# Patient Record
Sex: Female | Born: 1960 | Race: White | Hispanic: No | Marital: Married | Smoking: Never smoker
Health system: Southern US, Community
[De-identification: ages and names within clinical notes are randomized; demographics above are authoritative.]

## PROBLEM LIST (undated history)

## (undated) DIAGNOSIS — C801 Malignant (primary) neoplasm, unspecified: Secondary | ICD-10-CM

## (undated) DIAGNOSIS — Z923 Personal history of irradiation: Secondary | ICD-10-CM

## (undated) DIAGNOSIS — Z803 Family history of malignant neoplasm of breast: Secondary | ICD-10-CM

## (undated) HISTORY — DX: Family history of malignant neoplasm of breast: Z80.3

## (undated) HISTORY — PX: BREAST EXCISIONAL BIOPSY: SUR124

## (undated) HISTORY — PX: BREAST CYST ASPIRATION: SHX578

---

## 1998-02-02 ENCOUNTER — Other Ambulatory Visit: Admission: RE | Admit: 1998-02-02 | Discharge: 1998-02-02 | Payer: Self-pay | Admitting: Gynecology

## 1998-05-24 ENCOUNTER — Other Ambulatory Visit: Admission: RE | Admit: 1998-05-24 | Discharge: 1998-05-24 | Payer: Self-pay | Admitting: Gynecology

## 1998-09-21 ENCOUNTER — Other Ambulatory Visit: Admission: RE | Admit: 1998-09-21 | Discharge: 1998-09-21 | Payer: Self-pay | Admitting: Gynecology

## 1999-03-29 ENCOUNTER — Other Ambulatory Visit: Admission: RE | Admit: 1999-03-29 | Discharge: 1999-03-29 | Payer: Self-pay | Admitting: Gynecology

## 1999-03-29 ENCOUNTER — Encounter: Payer: Self-pay | Admitting: Gynecology

## 1999-03-29 ENCOUNTER — Encounter: Admission: RE | Admit: 1999-03-29 | Discharge: 1999-03-29 | Payer: Self-pay | Admitting: Gynecology

## 1999-10-12 ENCOUNTER — Other Ambulatory Visit: Admission: RE | Admit: 1999-10-12 | Discharge: 1999-10-12 | Payer: Self-pay | Admitting: Gynecology

## 2000-03-29 ENCOUNTER — Other Ambulatory Visit: Admission: RE | Admit: 2000-03-29 | Discharge: 2000-03-29 | Payer: Self-pay | Admitting: Gynecology

## 2000-03-29 ENCOUNTER — Encounter: Admission: RE | Admit: 2000-03-29 | Discharge: 2000-03-29 | Payer: Self-pay | Admitting: Gynecology

## 2000-03-29 ENCOUNTER — Encounter: Payer: Self-pay | Admitting: Gynecology

## 2001-04-02 ENCOUNTER — Encounter: Admission: RE | Admit: 2001-04-02 | Discharge: 2001-04-02 | Payer: Self-pay | Admitting: Gynecology

## 2001-04-02 ENCOUNTER — Other Ambulatory Visit: Admission: RE | Admit: 2001-04-02 | Discharge: 2001-04-02 | Payer: Self-pay | Admitting: Gynecology

## 2001-04-02 ENCOUNTER — Encounter: Payer: Self-pay | Admitting: Gynecology

## 2002-05-06 ENCOUNTER — Other Ambulatory Visit: Admission: RE | Admit: 2002-05-06 | Discharge: 2002-05-06 | Payer: Self-pay | Admitting: Gynecology

## 2002-05-06 ENCOUNTER — Encounter: Admission: RE | Admit: 2002-05-06 | Discharge: 2002-05-06 | Payer: Self-pay | Admitting: Gynecology

## 2002-05-06 ENCOUNTER — Encounter: Payer: Self-pay | Admitting: Gynecology

## 2002-09-09 ENCOUNTER — Other Ambulatory Visit: Admission: RE | Admit: 2002-09-09 | Discharge: 2002-09-09 | Payer: Self-pay | Admitting: Dermatology

## 2003-05-26 ENCOUNTER — Other Ambulatory Visit: Admission: RE | Admit: 2003-05-26 | Discharge: 2003-05-26 | Payer: Self-pay | Admitting: Gynecology

## 2003-05-26 ENCOUNTER — Encounter: Admission: RE | Admit: 2003-05-26 | Discharge: 2003-05-26 | Payer: Self-pay | Admitting: Gynecology

## 2004-06-28 ENCOUNTER — Other Ambulatory Visit: Admission: RE | Admit: 2004-06-28 | Discharge: 2004-06-28 | Payer: Self-pay | Admitting: Gynecology

## 2004-06-28 ENCOUNTER — Encounter: Admission: RE | Admit: 2004-06-28 | Discharge: 2004-06-28 | Payer: Self-pay | Admitting: Gynecology

## 2004-07-03 ENCOUNTER — Encounter: Admission: RE | Admit: 2004-07-03 | Discharge: 2004-07-03 | Payer: Self-pay | Admitting: Gynecology

## 2004-09-20 ENCOUNTER — Encounter: Admission: RE | Admit: 2004-09-20 | Discharge: 2004-09-20 | Payer: Self-pay | Admitting: Gynecology

## 2005-01-22 ENCOUNTER — Other Ambulatory Visit: Admission: RE | Admit: 2005-01-22 | Discharge: 2005-01-22 | Payer: Self-pay | Admitting: Dermatology

## 2005-07-04 ENCOUNTER — Encounter: Admission: RE | Admit: 2005-07-04 | Discharge: 2005-07-04 | Payer: Self-pay | Admitting: Gynecology

## 2005-07-04 ENCOUNTER — Other Ambulatory Visit: Admission: RE | Admit: 2005-07-04 | Discharge: 2005-07-04 | Payer: Self-pay | Admitting: Gynecology

## 2005-07-06 ENCOUNTER — Encounter: Admission: RE | Admit: 2005-07-06 | Discharge: 2005-07-06 | Payer: Self-pay | Admitting: Gynecology

## 2006-07-10 ENCOUNTER — Encounter: Admission: RE | Admit: 2006-07-10 | Discharge: 2006-07-10 | Payer: Self-pay | Admitting: Gynecology

## 2006-07-10 ENCOUNTER — Other Ambulatory Visit: Admission: RE | Admit: 2006-07-10 | Discharge: 2006-07-10 | Payer: Self-pay | Admitting: Gynecology

## 2006-07-24 ENCOUNTER — Encounter: Admission: RE | Admit: 2006-07-24 | Discharge: 2006-07-24 | Payer: Self-pay | Admitting: Gynecology

## 2007-08-04 ENCOUNTER — Encounter: Admission: RE | Admit: 2007-08-04 | Discharge: 2007-08-04 | Payer: Self-pay | Admitting: Gynecology

## 2007-08-29 ENCOUNTER — Encounter: Admission: RE | Admit: 2007-08-29 | Discharge: 2007-08-29 | Payer: Self-pay | Admitting: Gynecology

## 2008-03-26 ENCOUNTER — Encounter: Admission: RE | Admit: 2008-03-26 | Discharge: 2008-03-26 | Payer: Self-pay | Admitting: Gynecology

## 2008-08-04 ENCOUNTER — Encounter: Admission: RE | Admit: 2008-08-04 | Discharge: 2008-08-04 | Payer: Self-pay | Admitting: Gynecology

## 2009-08-19 ENCOUNTER — Encounter: Admission: RE | Admit: 2009-08-19 | Discharge: 2009-08-19 | Payer: Self-pay | Admitting: Gynecology

## 2009-10-04 ENCOUNTER — Encounter: Admission: RE | Admit: 2009-10-04 | Discharge: 2009-10-04 | Payer: Self-pay | Admitting: Gynecology

## 2010-05-14 ENCOUNTER — Encounter: Payer: Self-pay | Admitting: Gynecology

## 2010-07-17 ENCOUNTER — Other Ambulatory Visit: Payer: Self-pay | Admitting: Gynecology

## 2010-07-17 DIAGNOSIS — Z1231 Encounter for screening mammogram for malignant neoplasm of breast: Secondary | ICD-10-CM

## 2010-08-23 ENCOUNTER — Ambulatory Visit: Payer: Self-pay

## 2010-09-13 ENCOUNTER — Ambulatory Visit
Admission: RE | Admit: 2010-09-13 | Discharge: 2010-09-13 | Disposition: A | Discharge status: Home / Self Care | Payer: 59 | Source: Ambulatory Visit | Attending: Gynecology | Admitting: Gynecology

## 2010-09-13 DIAGNOSIS — Z1231 Encounter for screening mammogram for malignant neoplasm of breast: Secondary | ICD-10-CM

## 2011-03-22 ENCOUNTER — Encounter (HOSPITAL_COMMUNITY): Payer: Self-pay | Admitting: Pharmacy Technician

## 2011-03-23 ENCOUNTER — Encounter (HOSPITAL_COMMUNITY): Payer: Self-pay

## 2011-03-23 ENCOUNTER — Encounter (HOSPITAL_COMMUNITY)
Admission: RE | Admit: 2011-03-23 | Discharge: 2011-03-23 | Disposition: A | Discharge status: Home / Self Care | Payer: 59 | Source: Ambulatory Visit | Attending: General Surgery | Admitting: General Surgery

## 2011-03-23 LAB — CBC
HCT: 40.8 % (ref 36.0–46.0)
MCV: 91.7 fL (ref 78.0–100.0)
RBC: 4.45 MIL/uL (ref 3.87–5.11)
WBC: 9.2 10*3/uL (ref 4.0–10.5)

## 2011-03-23 LAB — DIFFERENTIAL
Eosinophils Relative: 0 % (ref 0–5)
Lymphocytes Relative: 22 % (ref 12–46)
Lymphs Abs: 2 10*3/uL (ref 0.7–4.0)
Monocytes Absolute: 0.6 10*3/uL (ref 0.1–1.0)

## 2011-03-23 LAB — SURGICAL PCR SCREEN: Staphylococcus aureus: POSITIVE — AB

## 2011-03-23 LAB — BASIC METABOLIC PANEL
CO2: 28 mEq/L (ref 19–32)
Chloride: 102 mEq/L (ref 96–112)
Potassium: 4.5 mEq/L (ref 3.5–5.1)
Sodium: 136 mEq/L (ref 135–145)

## 2011-03-23 LAB — HCG, SERUM, QUALITATIVE: Preg, Serum: NEGATIVE

## 2011-03-23 NOTE — H&P (Signed)
  NTS SOAP Note  Vital Signs:  Vitals as of: 02/22/2011: Systolic 119: Diastolic 71: Heart Rate 68: Temp 97.59F: Height 45ft 5.5in: Weight 139Lbs 0 Ounces: Pain Level 0: BMI 23  BMI : 22.78 kg/m2  Subjective: This 50 Years 1 Months old Female presents for of knot on abdominal wall.  Patient has noted a nodule on her left lower abdominal wall for several months.  Had a similar nodule/cyst in the past but is was excised.  No discharge.  No fevers or chills.  No pain.  Review of Symptoms:  Constitutional:unremarkable Head:unremarkable Eyes:unremarkable Nose/Mouth/Throat:unremarkable Cardiovascular:unremarkable Respiratory:unremarkable Gastrointestinal:unremarkable Genitourinary:unremarkable Musculoskeletal:unremarkable Skin:unremarkable Breast:unremarkable Hematolgic/Lymphatic:unremarkable Allergic/Immunologic:unremarkable   Past Medical History:Obtained   Past Medical History  Pregnancy Gravida: 1 Pregnancy Para: 1 Surgical History: none Medical Problems: none Psychiatric History: none Allergies: NKDA Medications: none   Social History:Obtained   Social History  Preferred Language: English ([place]) Race:  White Ethnicity: Not Hispanic / Latino Age: 50 Years 1 Months Marital Status:  M Alcohol: no Recreational drug(s): no   Smoking Status: Never smoker reviewed on 02/22/2011  Family History:Obtained   Family History  mother with breast ca    Objective Information: General:Well appearing, well nourished in no distress. Skin:no rash or prominent lesions Head:Atraumatic; no masses; no abnormalities Eyes:conjunctiva clear, EOM intact, PERRL Mouth:Mucous membranes moist, no mucosal lesions. Neck:Supple without lymphadenopathy.  Heart:RRR, no murmur Lungs:CTA bilaterally, no wheezes, rhonchi, rales.  Breathing unlabored. Abdomen:Soft, NT/ND, no HSM, no masses.Small mobile  non-tender nodule on the L lower abdominal wall.  Assessment:  Diagnosis &amp; Procedure: DiagnosisCode: 782.2, ProcedureCode: 14782,    Plan: Options discussed with the patient.  Will schedule at the patient's convenience.  Patient Education:Alternative treatments to surgery were discussed with patient (and family).Risks and benefits  of procedure were fully explained to the patient (and family) who gave informed consent. Patient/family questions were addressed.  Follow-up:Pending Surgery

## 2011-03-23 NOTE — Patient Instructions (Addendum)
20 Rebekah Green  03/23/2011   Your procedure is scheduled on:  03/30/2011  Report to [facility] at [place] AM.  Call this number if you have problems the morning of surgery: [phone]   Remember:   Do not eat food:After Midnight.  May have clear liquids:until Midnight .  Clear liquids include soda, tea, black coffee, apple or grape juice, broth.  Take these medicines the morning of surgery with A SIP OF WATER:    Do not wear jewelry, make-up or nail polish.  Do not wear lotions, powders, or perfumes. You may wear deodorant.  Do not shave 48 hours prior to surgery.  Do not bring valuables to the hospital.  Contacts, dentures or bridgework may not be worn into surgery.  Leave suitcase in the car. After surgery it may be brought to your room.  For patients admitted to the hospital, checkout time is 11:00 AM the day of discharge.   Patients discharged the day of surgery will not be allowed to drive home.  Name and phone number of your driver:   Special Instructions: CHG Shower Shower 2 days before surgery and 1 day before surgery with Hibiclens.   Please read over the following fact sheets that you were given: Pain Booklet, MRSA Information, Surgical Site Infection Prevention, Anesthesia Post-op Instructions and Care and Recovery After Surgery

## 2011-03-30 ENCOUNTER — Encounter (HOSPITAL_COMMUNITY): Payer: Self-pay | Admitting: Anesthesiology

## 2011-03-30 ENCOUNTER — Ambulatory Visit (HOSPITAL_COMMUNITY): Payer: 59 | Admitting: Anesthesiology

## 2011-03-30 ENCOUNTER — Encounter (HOSPITAL_COMMUNITY)
Admission: RE | Disposition: A | Discharge status: Home / Self Care | Payer: Self-pay | Source: Ambulatory Visit | Attending: General Surgery

## 2011-03-30 ENCOUNTER — Other Ambulatory Visit: Payer: Self-pay | Admitting: General Surgery

## 2011-03-30 ENCOUNTER — Encounter (HOSPITAL_COMMUNITY): Payer: Self-pay | Admitting: *Deleted

## 2011-03-30 ENCOUNTER — Ambulatory Visit (HOSPITAL_COMMUNITY)
Admission: RE | Admit: 2011-03-30 | Discharge: 2011-03-30 | Disposition: A | Discharge status: Home / Self Care | Payer: 59 | Source: Ambulatory Visit | Attending: General Surgery | Admitting: General Surgery

## 2011-03-30 DIAGNOSIS — Z01812 Encounter for preprocedural laboratory examination: Secondary | ICD-10-CM | POA: Insufficient documentation

## 2011-03-30 DIAGNOSIS — L723 Sebaceous cyst: Secondary | ICD-10-CM | POA: Insufficient documentation

## 2011-03-30 DIAGNOSIS — M7989 Other specified soft tissue disorders: Secondary | ICD-10-CM

## 2011-03-30 HISTORY — PX: MASS EXCISION: SHX2000

## 2011-03-30 SURGERY — EXCISION MASS
Anesthesia: General | Site: Abdomen | Wound class: Clean

## 2011-03-30 MED ORDER — LIDOCAINE HCL (PF) 1 % IJ SOLN
INTRAMUSCULAR | Status: AC
  Filled 2011-03-30: qty 30

## 2011-03-30 MED ORDER — LACTATED RINGERS IV SOLN
INTRAVENOUS | Status: DC | PRN
  Administered 2011-03-30: 07:00:00 via INTRAVENOUS

## 2011-03-30 MED ORDER — CEFAZOLIN SODIUM 1-5 GM-% IV SOLN: 1.0000 g | INTRAVENOUS | Status: DC

## 2011-03-30 MED ORDER — SODIUM CHLORIDE 0.9 % IR SOLN
Status: DC | PRN
  Administered 2011-03-30: 1000 mL

## 2011-03-30 MED ORDER — MIDAZOLAM HCL 2 MG/2ML IJ SOLN
1.0000 mg | INTRAMUSCULAR | Status: DC | PRN
Start: 2011-03-30 — End: 2011-03-30
  Administered 2011-03-30: 2 mg via INTRAVENOUS

## 2011-03-30 MED ORDER — CELECOXIB 100 MG PO CAPS
400.0000 mg | ORAL_CAPSULE | Freq: Every day | ORAL | Status: AC
  Administered 2011-03-30: 400 mg via ORAL

## 2011-03-30 MED ORDER — CEFAZOLIN SODIUM 1-5 GM-% IV SOLN
INTRAVENOUS | Status: AC
  Filled 2011-03-30: qty 50

## 2011-03-30 MED ORDER — ONDANSETRON HCL 4 MG/2ML IJ SOLN: 4.0000 mg | Freq: Once | INTRAMUSCULAR | Status: DC | PRN

## 2011-03-30 MED ORDER — ONDANSETRON HCL 4 MG/2ML IJ SOLN
INTRAMUSCULAR | Status: AC
  Administered 2011-03-30: 4 mg via INTRAVENOUS
  Filled 2011-03-30: qty 2

## 2011-03-30 MED ORDER — CELECOXIB 100 MG PO CAPS
ORAL_CAPSULE | ORAL | Status: AC
  Administered 2011-03-30: 400 mg via ORAL
  Filled 2011-03-30: qty 4

## 2011-03-30 MED ORDER — LIDOCAINE HCL (PF) 1 % IJ SOLN
INTRAMUSCULAR | Status: AC
  Filled 2011-03-30: qty 5

## 2011-03-30 MED ORDER — FENTANYL CITRATE 0.05 MG/ML IJ SOLN
INTRAMUSCULAR | Status: DC | PRN
  Administered 2011-03-30 (×2): 50 ug via INTRAVENOUS

## 2011-03-30 MED ORDER — FENTANYL CITRATE 0.05 MG/ML IJ SOLN
INTRAMUSCULAR | Status: AC
  Filled 2011-03-30: qty 5

## 2011-03-30 MED ORDER — ONDANSETRON HCL 4 MG/2ML IJ SOLN
4.0000 mg | Freq: Once | INTRAMUSCULAR | Status: AC
  Administered 2011-03-30: 4 mg via INTRAVENOUS

## 2011-03-30 MED ORDER — FENTANYL CITRATE 0.05 MG/ML IJ SOLN: 25.0000 ug | INTRAMUSCULAR | Status: DC | PRN

## 2011-03-30 MED ORDER — LACTATED RINGERS IV SOLN: INTRAVENOUS | Status: DC

## 2011-03-30 MED ORDER — PROPOFOL 10 MG/ML IV EMUL
INTRAVENOUS | Status: AC
  Filled 2011-03-30: qty 20

## 2011-03-30 MED ORDER — PROPOFOL 10 MG/ML IV EMUL
INTRAVENOUS | Status: DC | PRN
  Administered 2011-03-30: 150 mg via INTRAVENOUS

## 2011-03-30 MED ORDER — GLYCOPYRROLATE 0.2 MG/ML IJ SOLN
0.2000 mg | Freq: Once | INTRAMUSCULAR | Status: AC | PRN
  Administered 2011-03-30: 0.2 mg via INTRAVENOUS

## 2011-03-30 MED ORDER — GLYCOPYRROLATE 0.2 MG/ML IJ SOLN
INTRAMUSCULAR | Status: AC
  Administered 2011-03-30: 0.2 mg via INTRAVENOUS
  Filled 2011-03-30: qty 1

## 2011-03-30 MED ORDER — HYDROCODONE-ACETAMINOPHEN 5-325 MG PO TABS: 1.0000 | ORAL_TABLET | ORAL | Status: AC | PRN

## 2011-03-30 MED ORDER — BUPIVACAINE HCL (PF) 0.5 % IJ SOLN
INTRAMUSCULAR | Status: DC | PRN
  Administered 2011-03-30: 10 mL

## 2011-03-30 MED ORDER — ACETAMINOPHEN 325 MG PO TABS: 325.0000 mg | ORAL_TABLET | ORAL | Status: DC | PRN

## 2011-03-30 MED ORDER — LACTATED RINGERS IV SOLN
INTRAVENOUS | Status: DC
  Administered 2011-03-30: 1000 mL via INTRAVENOUS

## 2011-03-30 MED ORDER — MIDAZOLAM HCL 2 MG/2ML IJ SOLN
INTRAMUSCULAR | Status: AC
  Filled 2011-03-30: qty 2

## 2011-03-30 MED ORDER — ENOXAPARIN SODIUM 40 MG/0.4ML ~~LOC~~ SOLN
40.0000 mg | Freq: Once | SUBCUTANEOUS | Status: AC
  Administered 2011-03-30: 40 mg via SUBCUTANEOUS

## 2011-03-30 MED ORDER — CEFAZOLIN SODIUM 1-5 GM-% IV SOLN
INTRAVENOUS | Status: DC | PRN
  Administered 2011-03-30: 1 g via INTRAVENOUS

## 2011-03-30 MED ORDER — LIDOCAINE HCL (CARDIAC) 10 MG/ML IV SOLN
INTRAVENOUS | Status: DC | PRN
  Administered 2011-03-30: 50 mg via INTRAVENOUS

## 2011-03-30 MED ORDER — BUPIVACAINE HCL (PF) 0.5 % IJ SOLN
INTRAMUSCULAR | Status: AC
  Filled 2011-03-30: qty 30

## 2011-03-30 MED ORDER — ENOXAPARIN SODIUM 40 MG/0.4ML ~~LOC~~ SOLN
SUBCUTANEOUS | Status: AC
  Filled 2011-03-30: qty 0.4

## 2011-03-30 SURGICAL SUPPLY — 32 items
BAG HAMPER (MISCELLANEOUS) ×2 IMPLANT
BENZOIN TINCTURE PRP APPL 2/3 (GAUZE/BANDAGES/DRESSINGS) ×2 IMPLANT
CLOTH BEACON ORANGE TIMEOUT ST (SAFETY) ×2 IMPLANT
COVER LIGHT HANDLE STERIS (MISCELLANEOUS) ×4 IMPLANT
DURAPREP 26ML APPLICATOR (WOUND CARE) ×2 IMPLANT
ELECT NEEDLE TIP 2.8 STRL (NEEDLE) IMPLANT
ELECT REM PT RETURN 9FT ADLT (ELECTROSURGICAL) ×2
ELECTRODE REM PT RTRN 9FT ADLT (ELECTROSURGICAL) ×1 IMPLANT
FORMALIN 10 PREFIL 120ML (MISCELLANEOUS) ×2 IMPLANT
GLOVE BIOGEL PI IND STRL 7.0 (GLOVE) ×2 IMPLANT
GLOVE BIOGEL PI IND STRL 7.5 (GLOVE) ×1 IMPLANT
GLOVE BIOGEL PI INDICATOR 7.0 (GLOVE) ×2
GLOVE BIOGEL PI INDICATOR 7.5 (GLOVE) ×1
GLOVE ECLIPSE 6.5 STRL STRAW (GLOVE) ×2 IMPLANT
GLOVE ECLIPSE 7.0 STRL STRAW (GLOVE) ×2 IMPLANT
GOWN STRL REIN XL XLG (GOWN DISPOSABLE) ×4 IMPLANT
KIT ROOM TURNOVER APOR (KITS) ×2 IMPLANT
MANIFOLD NEPTUNE II (INSTRUMENTS) ×2 IMPLANT
NEEDLE HYPO 18GX1.5 BLUNT FILL (NEEDLE) IMPLANT
NEEDLE HYPO 25X1 1.5 SAFETY (NEEDLE) ×2 IMPLANT
NS IRRIG 1000ML POUR BTL (IV SOLUTION) ×2 IMPLANT
PACK MINOR (CUSTOM PROCEDURE TRAY) ×2 IMPLANT
PAD ARMBOARD 7.5X6 YLW CONV (MISCELLANEOUS) ×2 IMPLANT
SET BASIN LINEN APH (SET/KITS/TRAYS/PACK) ×2 IMPLANT
SOL PREP PROV IODINE SCRUB 4OZ (MISCELLANEOUS) IMPLANT
STRIP CLOSURE SKIN 1/2X4 (GAUZE/BANDAGES/DRESSINGS) ×2 IMPLANT
SUT MNCRL AB 4-0 PS2 18 (SUTURE) ×2 IMPLANT
SUT PROLENE 3 0 PS 1 (SUTURE) IMPLANT
SUT VIC AB 3-0 SH 27 (SUTURE) ×1
SUT VIC AB 3-0 SH 27X BRD (SUTURE) ×1 IMPLANT
SYR BULB IRRIGATION 50ML (SYRINGE) ×2 IMPLANT
SYR CONTROL 10ML LL (SYRINGE) ×2 IMPLANT

## 2011-03-30 NOTE — Transfer of Care (Signed)
Immediate Anesthesia Transfer of Care Note  Patient: Rebekah Green  Procedure(s) Performed:  EXCISION MASS - Excision Soft Tissue Mass Abdominal Wall  Patient Location: PACU  Anesthesia Type: General  Level of Consciousness: sedated and patient cooperative  Airway & Oxygen Therapy: Patient Spontanous Breathing and Patient connected to face mask oxygen  Post-op Assessment: Report given to PACU RN and Post -op Vital signs reviewed and stable  Post vital signs: Reviewed and stable  Complications: No apparent anesthesia complications

## 2011-03-30 NOTE — Anesthesia Preprocedure Evaluation (Signed)
Anesthesia Evaluation  Patient identified by MRN, date of birth, ID band Patient awake    Reviewed: Allergy & Precautions, H&P , NPO status , Patient's Chart, lab work & pertinent test results  Airway Mallampati: I TM Distance: >3 FB Neck ROM: Full    Dental No notable dental hx.    Pulmonary neg pulmonary ROS,    Pulmonary exam normal       Cardiovascular neg cardio ROS Regular Normal    Neuro/Psych Negative Neurological ROS  Negative Psych ROS   GI/Hepatic negative GI ROS, Neg liver ROS,   Endo/Other  Negative Endocrine ROS  Renal/GU negative Renal ROS  Genitourinary negative   Musculoskeletal negative musculoskeletal ROS (+)   Abdominal Normal abdominal exam  (+)   Peds  Hematology negative hematology ROS (+)   Anesthesia Other Findings   Reproductive/Obstetrics negative OB ROS                           Anesthesia Physical Anesthesia Plan  ASA: I  Anesthesia Plan: General   Post-op Pain Management:    Induction: Intravenous  Airway Management Planned: LMA  Additional Equipment:   Intra-op Plan:   Post-operative Plan: Extubation in OR  Informed Consent: I have reviewed the patients History and Physical, chart, labs and discussed the procedure including the risks, benefits and alternatives for the proposed anesthesia with the patient or authorized representative who has indicated his/her understanding and acceptance.     Plan Discussed with: CRNA  Anesthesia Plan Comments:         Anesthesia Quick Evaluation

## 2011-03-30 NOTE — Interval H&P Note (Signed)
History and Physical Interval Note:  03/30/2011 7:48 AM  Rebekah Green  has presented today for surgery, with the diagnosis of Soft tissue mass [729.90]  The various methods of treatment have been discussed with the patient and family. After consideration of risks, benefits and other options for treatment, the patient has consented to  Procedure(s): EXCISION MASS as a surgical intervention .  The patients' history has been reviewed, patient examined, no change in status, stable for surgery.  I have reviewed the patients' chart and labs.  Questions were answered to the patient's satisfaction.     [name]

## 2011-03-30 NOTE — Anesthesia Procedure Notes (Signed)
Procedure Name: LMA Insertion Date/Time: 03/30/2011 8:11 AM Performed by: Carolyne Littles, Marielouise Amey Pre-anesthesia Checklist: Patient identified, Patient being monitored, Emergency Drugs available, Timeout performed and Suction available Patient Re-evaluated:Patient Re-evaluated prior to inductionOxygen Delivery Method: Circle System Utilized Preoxygenation: Pre-oxygenation with 100% oxygen Intubation Type: IV induction Ventilation: Mask ventilation without difficulty LMA: LMA inserted LMA Size: 4.0 Placement Confirmation: positive ETCO2 and breath sounds checked- equal and bilateral Dental Injury: Teeth and Oropharynx as per pre-operative assessment

## 2011-03-30 NOTE — Preoperative (Signed)
Beta Blockers   Reason not to administer Beta Blockers:Not Applicable 

## 2011-03-30 NOTE — Anesthesia Postprocedure Evaluation (Signed)
  Anesthesia Post-op Note  Patient: Rebekah Green  Procedure(s) Performed:  EXCISION MASS - Excision Soft Tissue Mass Abdominal Wall  Patient Location: PACU  Anesthesia Type: General  Level of Consciousness: sedated and patient cooperative  Airway and Oxygen Therapy: Patient Spontanous Breathing and Patient connected to face mask oxygen  Post-op Pain: none  Post-op Assessment: Post-op Vital signs reviewed, Patient's Cardiovascular Status Stable, Respiratory Function Stable and Patent Airway  Post-op Vital Signs: Reviewed and stable  Complications: No apparent anesthesia complications

## 2011-03-30 NOTE — Op Note (Signed)
Patient:  Rebekah Green  DOB:  [birth year]  MRN:  [id number]   Preop Diagnosis:  Soft tissue mass of the left abdominal wall  Postop Diagnosis:  The same  Procedure:  Excision of soft tissue mass via 2.5 cm incision  Surgeon:  Dr. Tilford Pillar  Anes:  General via laryngeal mask airway  Indications:  Patient is a 50 year old female presented to my office with a history of a nodule on her left anterior abdominal wall. This is slowly increased in size and patient wished for excision due to her concerns of the size change. Risks benefits alternatives were discussed at length the patient including but not limited to risk of bleeding, infection, recurrence, intraoperative cardiac and pulmonary events. Her questions and concerns were addressed the patient was consented for the planned procedure.  Procedure note:  Patient was taken to the or was placed in supine position the operator table time the general anesthetic is administered. At this point a larger mask airway was placed by the nurse anesthetist. Her abdomen is prepped with DuraPrep solution and draped in standard fashion. A 2.5 cm elliptical incision created with a 15 blade scalpel. Additional dissection down to subcuticular tissues carried out using electrocautery including circumferential dissection around the soft tissue nodule. Once free is placed in the back table and sent as a permanent specimen to pathology. Hemostasis is excellent having been attainable electrocautery. The wound is irrigated. Local anesthetic is instilled. A 3-0 Vicryl was utilized reapproximate the deep subcuticular tissue. A 4-0 Monocryl utilized reapproximate the skin edges in a running subcuticular suture. The skin was washed dried moist dry towel. Benzoin is applied around incision. Half-inch Steri-Strips are placed. The drapes removed. The patient was allowed to come out of general anesthetic and stretcher to the postanesthetic care unit in stable condition. At the  conclusion of the procedure all instrument, sponge, needle counts are correct. Patient tolerated procedure extremely well.  Complications:  None  EBL:  Scant  Specimen:  Soft tissue mass

## 2011-04-09 ENCOUNTER — Encounter (HOSPITAL_COMMUNITY): Payer: Self-pay | Admitting: General Surgery

## 2011-09-10 ENCOUNTER — Other Ambulatory Visit: Payer: Self-pay | Admitting: Gynecology

## 2011-09-10 DIAGNOSIS — N6009 Solitary cyst of unspecified breast: Secondary | ICD-10-CM

## 2011-09-10 DIAGNOSIS — Z1231 Encounter for screening mammogram for malignant neoplasm of breast: Secondary | ICD-10-CM

## 2011-09-13 ENCOUNTER — Other Ambulatory Visit: Payer: Self-pay | Admitting: Gynecology

## 2011-09-13 DIAGNOSIS — N6009 Solitary cyst of unspecified breast: Secondary | ICD-10-CM

## 2011-09-14 ENCOUNTER — Ambulatory Visit
Admission: RE | Admit: 2011-09-14 | Discharge: 2011-09-14 | Disposition: A | Discharge status: Home / Self Care | Payer: 59 | Source: Ambulatory Visit | Attending: Gynecology | Admitting: Gynecology

## 2011-09-14 ENCOUNTER — Ambulatory Visit: Payer: 59

## 2011-09-14 DIAGNOSIS — N6009 Solitary cyst of unspecified breast: Secondary | ICD-10-CM

## 2011-09-21 ENCOUNTER — Other Ambulatory Visit: Payer: Self-pay | Admitting: Gynecology

## 2011-09-21 DIAGNOSIS — N6001 Solitary cyst of right breast: Secondary | ICD-10-CM

## 2011-09-26 ENCOUNTER — Other Ambulatory Visit: Payer: 59

## 2012-08-06 ENCOUNTER — Other Ambulatory Visit: Payer: Self-pay

## 2012-08-06 DIAGNOSIS — Z1231 Encounter for screening mammogram for malignant neoplasm of breast: Secondary | ICD-10-CM

## 2012-10-01 ENCOUNTER — Ambulatory Visit
Admission: RE | Admit: 2012-10-01 | Discharge: 2012-10-01 | Disposition: A | Discharge status: Home / Self Care | Payer: 59 | Source: Ambulatory Visit

## 2012-10-01 DIAGNOSIS — Z1231 Encounter for screening mammogram for malignant neoplasm of breast: Secondary | ICD-10-CM

## 2013-09-25 ENCOUNTER — Other Ambulatory Visit: Payer: Self-pay

## 2013-09-25 DIAGNOSIS — Z1231 Encounter for screening mammogram for malignant neoplasm of breast: Secondary | ICD-10-CM

## 2013-10-02 ENCOUNTER — Ambulatory Visit
Admission: RE | Admit: 2013-10-02 | Discharge: 2013-10-02 | Disposition: A | Discharge status: Home / Self Care | Payer: 59 | Source: Ambulatory Visit

## 2013-10-02 DIAGNOSIS — Z1231 Encounter for screening mammogram for malignant neoplasm of breast: Secondary | ICD-10-CM

## 2014-11-17 ENCOUNTER — Other Ambulatory Visit: Payer: Self-pay

## 2014-11-17 DIAGNOSIS — Z1231 Encounter for screening mammogram for malignant neoplasm of breast: Secondary | ICD-10-CM

## 2014-11-17 DIAGNOSIS — Z803 Family history of malignant neoplasm of breast: Secondary | ICD-10-CM

## 2014-12-31 ENCOUNTER — Ambulatory Visit: Payer: Self-pay

## 2015-02-04 ENCOUNTER — Ambulatory Visit: Payer: Self-pay

## 2015-02-24 ENCOUNTER — Ambulatory Visit
Admission: RE | Admit: 2015-02-24 | Discharge: 2015-02-24 | Disposition: A | Discharge status: Home / Self Care | Payer: 59 | Source: Ambulatory Visit

## 2015-02-24 DIAGNOSIS — Z1231 Encounter for screening mammogram for malignant neoplasm of breast: Secondary | ICD-10-CM

## 2015-02-24 DIAGNOSIS — Z803 Family history of malignant neoplasm of breast: Secondary | ICD-10-CM

## 2016-01-30 ENCOUNTER — Other Ambulatory Visit: Payer: Self-pay | Admitting: Gynecology

## 2016-01-30 DIAGNOSIS — Z1231 Encounter for screening mammogram for malignant neoplasm of breast: Secondary | ICD-10-CM

## 2016-07-31 DIAGNOSIS — Z6823 Body mass index (BMI) 23.0-23.9, adult: Secondary | ICD-10-CM | POA: Diagnosis not present

## 2016-07-31 DIAGNOSIS — N951 Menopausal and female climacteric states: Secondary | ICD-10-CM | POA: Diagnosis not present

## 2016-07-31 DIAGNOSIS — Z01419 Encounter for gynecological examination (general) (routine) without abnormal findings: Secondary | ICD-10-CM | POA: Diagnosis not present

## 2016-08-16 DIAGNOSIS — L570 Actinic keratosis: Secondary | ICD-10-CM | POA: Diagnosis not present

## 2016-08-16 DIAGNOSIS — D225 Melanocytic nevi of trunk: Secondary | ICD-10-CM | POA: Diagnosis not present

## 2016-08-16 DIAGNOSIS — X32XXXD Exposure to sunlight, subsequent encounter: Secondary | ICD-10-CM | POA: Diagnosis not present

## 2016-08-17 DIAGNOSIS — R7301 Impaired fasting glucose: Secondary | ICD-10-CM | POA: Diagnosis not present

## 2016-08-20 DIAGNOSIS — R7301 Impaired fasting glucose: Secondary | ICD-10-CM | POA: Diagnosis not present

## 2016-08-20 DIAGNOSIS — E875 Hyperkalemia: Secondary | ICD-10-CM | POA: Diagnosis not present

## 2016-08-20 DIAGNOSIS — Z0000 Encounter for general adult medical examination without abnormal findings: Secondary | ICD-10-CM | POA: Diagnosis not present

## 2016-08-20 DIAGNOSIS — M8580 Other specified disorders of bone density and structure, unspecified site: Secondary | ICD-10-CM | POA: Diagnosis not present

## 2017-01-18 DIAGNOSIS — R300 Dysuria: Secondary | ICD-10-CM | POA: Diagnosis not present

## 2017-01-18 DIAGNOSIS — A499 Bacterial infection, unspecified: Secondary | ICD-10-CM | POA: Diagnosis not present

## 2017-01-18 DIAGNOSIS — N390 Urinary tract infection, site not specified: Secondary | ICD-10-CM | POA: Diagnosis not present

## 2017-02-15 DIAGNOSIS — Z23 Encounter for immunization: Secondary | ICD-10-CM | POA: Diagnosis not present

## 2017-04-23 HISTORY — PX: BREAST LUMPECTOMY: SHX2

## 2017-06-12 ENCOUNTER — Other Ambulatory Visit: Payer: Self-pay | Admitting: Gynecology

## 2017-06-12 DIAGNOSIS — Z1231 Encounter for screening mammogram for malignant neoplasm of breast: Secondary | ICD-10-CM

## 2017-06-28 ENCOUNTER — Ambulatory Visit
Admission: RE | Admit: 2017-06-28 | Discharge: 2017-06-28 | Disposition: A | Discharge status: Home / Self Care | Payer: 59 | Source: Ambulatory Visit | Attending: Gynecology | Admitting: Gynecology

## 2017-06-28 DIAGNOSIS — Z1231 Encounter for screening mammogram for malignant neoplasm of breast: Secondary | ICD-10-CM

## 2017-09-05 ENCOUNTER — Other Ambulatory Visit: Payer: Self-pay | Admitting: Gynecology

## 2017-09-05 DIAGNOSIS — E2839 Other primary ovarian failure: Secondary | ICD-10-CM

## 2017-09-05 DIAGNOSIS — M8580 Other specified disorders of bone density and structure, unspecified site: Secondary | ICD-10-CM

## 2017-09-21 DIAGNOSIS — C801 Malignant (primary) neoplasm, unspecified: Secondary | ICD-10-CM

## 2017-09-21 HISTORY — DX: Malignant (primary) neoplasm, unspecified: C80.1

## 2017-10-08 ENCOUNTER — Other Ambulatory Visit: Payer: Self-pay | Admitting: Gynecology

## 2017-10-08 DIAGNOSIS — N6320 Unspecified lump in the left breast, unspecified quadrant: Secondary | ICD-10-CM

## 2017-10-09 ENCOUNTER — Ambulatory Visit
Admission: RE | Admit: 2017-10-09 | Discharge: 2017-10-09 | Disposition: A | Discharge status: Home / Self Care | Payer: 59 | Source: Ambulatory Visit | Attending: Gynecology | Admitting: Gynecology

## 2017-10-09 ENCOUNTER — Other Ambulatory Visit: Payer: Self-pay | Admitting: Gynecology

## 2017-10-09 DIAGNOSIS — R921 Mammographic calcification found on diagnostic imaging of breast: Secondary | ICD-10-CM

## 2017-10-09 DIAGNOSIS — N6320 Unspecified lump in the left breast, unspecified quadrant: Secondary | ICD-10-CM

## 2017-10-10 ENCOUNTER — Other Ambulatory Visit: Payer: Self-pay | Admitting: Gynecology

## 2017-10-10 DIAGNOSIS — C50919 Malignant neoplasm of unspecified site of unspecified female breast: Secondary | ICD-10-CM

## 2017-10-11 ENCOUNTER — Encounter: Payer: Self-pay | Admitting: *Deleted

## 2017-10-11 ENCOUNTER — Telehealth: Payer: Self-pay | Admitting: Oncology

## 2017-10-11 DIAGNOSIS — D0512 Intraductal carcinoma in situ of left breast: Secondary | ICD-10-CM

## 2017-10-11 NOTE — Telephone Encounter (Signed)
Spoke to patient to confirm afternoon [facility] appointment for 6/26, packet e-mailed to patient

## 2017-10-14 ENCOUNTER — Ambulatory Visit
Admission: RE | Admit: 2017-10-14 | Discharge: 2017-10-14 | Disposition: A | Discharge status: Home / Self Care | Payer: 59 | Source: Ambulatory Visit | Attending: Gynecology | Admitting: Gynecology

## 2017-10-14 ENCOUNTER — Ambulatory Visit: Payer: 59

## 2017-10-14 DIAGNOSIS — C50919 Malignant neoplasm of unspecified site of unspecified female breast: Secondary | ICD-10-CM

## 2017-10-15 NOTE — Progress Notes (Signed)
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 However, she subsequently developed a palpable abnormality in the left breast. She underwent unilateral left diagnostic mammography with tomography and left breast ultrasonography at [facility] on 10/09/2017 showing: breast density category C. At the 3 o'clock position upper outer quadrant of the left breast, there is an oval partially obscured mass with grouped calcification measuring 0.4 x 0.3 cm. By ultrasound, there was a 2.1 cm mass mass consistent with benign cyst and was hypoechoic with anechoic portions. The right axilla was negative for lymphadenopathy. Unilateral right diagnostic mammography showed no evidence of malignancy in the right breast.   Accordingly on 10/09/2017 she proceeded to biopsy of the left breast area in question. The pathology from this procedure showed ([id number]): Ductal carcinoma in situ, intermediate grade, with calcifications. Prognostic indicators significant for: both estrogen and progesterone receptor, 0% negative.  The cyst was drained with no residual postprocedure.   The patient's subsequent history is as detailed below.  INTERVAL HISTORY: Rebekah Green was evaluated in the multidisciplinary  breast cancer clinic on 10/16/2017 accompanied by her husband, son and daughter in law. Her case was also presented at the multidisciplinary breast cancer conference on the same day. At that time a preliminary plan was proposed: Lumpectomy adjuvant radiation, genetics, and consideration of antiestrogens prophylactically   REVIEW OF SYSTEMS: There were no specific symptoms leading to the original mammogram, which was routinely scheduled. The patient denies unusual headaches, visual changes, nausea, vomiting, stiff neck, dizziness, or gait imbalance. There has been no cough, phlegm production, or pleurisy, no chest pain or pressure, and no change in bowel or bladder habits. The patient denies fever, rash, bleeding, unexplained fatigue or unexplained weight loss. A detailed review of systems was otherwise entirely negative.   PAST MEDICAL HISTORY: No past medical history on file.    PAST SURGICAL HISTORY: Past Surgical History:  Procedure Laterality Date  . BREAST CYST ASPIRATION    . BREAST EXCISIONAL BIOPSY     unsure of breast, no scar  . MASS EXCISION  03/30/2011   Procedure: EXCISION MASS;  Surgeon: Donato Heinz;  Location: [facility];  Service: General;  Laterality: N/A;  Excision Soft Tissue Mass Abdominal Wall    FAMILY HISTORY Family History  Problem Relation Age of Onset  . Breast cancer Mother        mid 51's  . Non-Hodgkin's lymphoma Brother        B cell  . Anesthesia problems Neg Hx   . Hypotension Neg Hx   . Malignant hyperthermia Neg Hx   . Pseudochol deficiency Neg Hx   As of June 2019, the patient's father is alive at age 71. The patient's mother is alive at age 52, with a history of breast cancer diagnosed at age 38. The patient has 1 brother and 2 sisters. The patient's brother was diagnosed  with non- Hodgkin's lymphoma remotely. There was a 1st cousin with breast cancer diagnosed in the late 50's. The patient's grandmother's sister also had breast cancer. The patient  denies a family history of ovarian cancer.    GYNECOLOGIC HISTORY:  Patient's last menstrual period was 03/22/2011. 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  Age at first live birth: 57 years old She is GXP1. Her LMP was April 2017. She used oral contraceptive for 5 years with no complications. She never used HRT.   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 The patient is married to Richmond "[name]" Klutts, who is an [facility] [facility] specialist for environmental safety. Their son, [name] "[name]" is 8 lives in [place] and works as a Government social research officer for [employer], IT trainer stations.      ADVANCED DIRECTIVES:    HEALTH MAINTENANCE: Social History   Tobacco Use  . Smoking status: Never Smoker  Substance Use Topics  . Alcohol use: No  . Drug use: No     Colonoscopy: 2010  PAP: 08/2017  Bone density: 2008 - scheduled 10/18/2017   No Known Allergies  Current Outpatient Medications  Medication Sig Dispense Refill  . Ascorbic Acid (VITAMIN C) 1000 MG tablet Take 1,000 mg by mouth daily.    . Cholecalciferol (VITAMIN D) 2000 UNITS tablet Take 2,000 Units by mouth every morning.      . Multiple Vitamins-Minerals (MULTIVITAMINS THER. W/MINERALS) TABS Take 1 tablet by mouth every morning.       No current facility-administered medications for this visit.     OBJECTIVE: Middle-aged white woman in no acute distress  Vitals:   10/16/17 1300  BP: 134/75  Pulse: 80  Resp: 18  Temp: 98 F (36.7 C)  SpO2: 100%     Body mass index is 23.25 kg/m.   Wt Readings from Last 3 Encounters:  10/16/17 139 lb 11.2 oz (63.4 kg)  03/23/11 130 lb (59 kg)      ECOG FS:0 - Asymptomatic  Ocular: Sclerae unicteric, pupils round and equal Ear-nose-throat: Oropharynx clear and moist Lymphatic: No cervical or supraclavicular adenopathy Lungs no rales or rhonchi Heart regular rate and rhythm Abd soft, nontender, positive bowel  sounds MSK no focal spinal tenderness, no joint edema Neuro: non-focal, well-oriented, appropriate affect Breasts: Right breast is benign per the left breast is status post recent biopsy.  There is a mild ecchymosis.  There is no palpable mass.  Both axillae are benign.   LAB RESULTS:  CMP     Component Value Date/Time   NA 141 10/16/2017 1220   K 3.9 10/16/2017 1220   CL 103 10/16/2017 1220   CO2 30 10/16/2017 1220   GLUCOSE 103 (H) 10/16/2017 1220   BUN 14 10/16/2017 1220   CREATININE 0.89 10/16/2017 1220   CALCIUM 9.5 10/16/2017 1220   PROT 7.3 10/16/2017 1220   ALBUMIN 4.5 10/16/2017 1220   AST 18 10/16/2017 1220   ALT 14 10/16/2017 1220   ALKPHOS 89 10/16/2017 1220   BILITOT 0.4 10/16/2017 1220   GFRNONAA >60 10/16/2017 1220   GFRAA >60 10/16/2017 1220    No results found for: TOTALPROTELP, ALBUMINELP, A1GS, A2GS, BETS, BETA2SER, GAMS, MSPIKE, SPEI  No results found for: KPAFRELGTCHN, LAMBDASER, KAPLAMBRATIO  Lab Results  Component Value Date   WBC 6.2 10/16/2017   NEUTROABS 3.4 10/16/2017   HGB 14.7 10/16/2017   HCT 43.9 10/16/2017   MCV 90.7 10/16/2017   PLT  217 10/16/2017    _0 @  No results found for: LABCA2  No components found for: KPTWSF681  No results for input(s): INR in the last 168 hours.  No results found for: LABCA2  No results found for: EXN170  No results found for: YFV494  No results found for: WHQ759  No results found for: CA2729  No components found for: HGQUANT  No results found for: CEA1 / No results found for: CEA1   No results found for: AFPTUMOR  No results found for: CHROMOGRNA  No results found for: PSA1  Appointment on 10/16/2017  Component Date Value Ref Range Status  . WBC Count 10/16/2017 6.2  3.9 - 10.3 K/uL Final  . RBC 10/16/2017 4.85  3.70 - 5.45 MIL/uL Final  . Hemoglobin 10/16/2017 14.7  11.6 - 15.9 g/dL Final  . HCT 10/16/2017 43.9  34.8 - 46.6 % Final  . MCV 10/16/2017 90.7  79.5 - 101.0  fL Final  . MCH 10/16/2017 30.3  25.1 - 34.0 pg Final  . MCHC 10/16/2017 33.4  31.5 - 36.0 g/dL Final  . RDW 10/16/2017 12.4  11.2 - 14.5 % Final  . Platelet Count 10/16/2017 217  145 - 400 K/uL Final  . Neutrophils Relative % 10/16/2017 53  % Final  . Neutro Abs 10/16/2017 3.4  1.5 - 6.5 K/uL Final  . Lymphocytes Relative 10/16/2017 38  % Final  . Lymphs Abs 10/16/2017 2.3  0.9 - 3.3 K/uL Final  . Monocytes Relative 10/16/2017 7  % Final  . Monocytes Absolute 10/16/2017 0.4  0.1 - 0.9 K/uL Final  . Eosinophils Relative 10/16/2017 1  % Final  . Eosinophils Absolute 10/16/2017 0.0  0.0 - 0.5 K/uL Final  . Basophils Relative 10/16/2017 1  % Final  . Basophils Absolute 10/16/2017 0.0  0.0 - 0.1 K/uL Final   Performed at [facility], [place] [address], [place], [place] [address]  . Sodium 10/16/2017 141  135 - 145 mmol/L Final   Please note reference intervals were recently updated.  . Potassium 10/16/2017 3.9  3.5 - 5.1 mmol/L Final  . Chloride 10/16/2017 103  98 - 111 mmol/L Final  . CO2 10/16/2017 30  22 - 32 mmol/L Final  . Glucose, Bld 10/16/2017 103* 70 - 99 mg/dL Final  . BUN 10/16/2017 14  6 - 20 mg/dL Final   Please note change in reference range.  . Creatinine 10/16/2017 0.89  0.44 - 1.00 mg/dL Final  . Calcium 10/16/2017 9.5  8.9 - 10.3 mg/dL Final  . Total Protein 10/16/2017 7.3  6.5 - 8.1 g/dL Final  . Albumin 10/16/2017 4.5  3.5 - 5.0 g/dL Final  . AST 10/16/2017 18  15 - 41 U/L Final  . ALT 10/16/2017 14  0 - 44 U/L Final  . Alkaline Phosphatase 10/16/2017 89  38 - 126 U/L Final  . Total Bilirubin 10/16/2017 0.4  0.3 - 1.2 mg/dL Final  . GFR, Est Non Af Am 10/16/2017 >60  >60 mL/min Final  . GFR, Est AFR Am 10/16/2017 >60  >60 mL/min Final   Comment: (NOTE) The eGFR has been calculated using the CKD EPI equation. This calculation has not been validated in all clinical situations. eGFR's persistently <60 mL/min signify possible Chronic  Kidney Disease.   Georgiann Hahn gap 10/16/2017 8  5 - 15 Final   Performed at [facility], [place] [address], [place], [place] [address]    (this displays the last labs  from the last 3 days)  No results found for: TOTALPROTELP, ALBUMINELP, A1GS, A2GS, BETS, BETA2SER, GAMS, MSPIKE, SPEI (this displays SPEP labs)  No results found for: KPAFRELGTCHN, LAMBDASER, KAPLAMBRATIO (kappa/lambda light chains)  No results found for: HGBA, HGBA2QUANT, HGBFQUANT, HGBSQUAN (Hemoglobinopathy evaluation)   No results found for: LDH  No results found for: IRON, TIBC, IRONPCTSAT (Iron and TIBC)  No results found for: FERRITIN  Urinalysis No results found for: COLORURINE, APPEARANCEUR, LABSPEC, PHURINE, GLUCOSEU, HGBUR, BILIRUBINUR, KETONESUR, PROTEINUR, UROBILINOGEN, NITRITE, LEUKOCYTESUR   STUDIES: US Breast Ltd Uni Left Inc Axilla  Result Date: 10/09/2017 CLINICAL DATA:  Palpable abnormality in the LEFT breast, 3 o'clock location EXAM: DIGITAL DIAGNOSTIC LEFT MAMMOGRAM WITH CAD AND TOMO ULTRASOUND LEFT BREAST COMPARISON:  06/28/2017 and earlier ACR Breast Density Category c: The breast tissue is heterogeneously dense, which may obscure small masses. FINDINGS: Within the LATERAL mid aspect of the LEFT breast, there is an oval partially obscured mass marked with a BB as the palpable abnormality. Within the UPPER-OUTER QUADRANT of the LEFT breast there is a group of calcifications further evaluated on magnified views. On these views there is a group of calcifications measuring 0 4 x 3 millimeters. Calcifications are primarily punctate but vary in size and shape. Mammographic images were processed with CAD. On physical exam, I palpate a rounded mobile mass in the LATERAL aspect of the LEFT breast. Targeted ultrasound is performed, showing an oval circumscribed hypoechoic mass with anechoic portions. No mobility of the internal echoes on real-time evaluation. No internal blood flow by  Doppler evaluation. Evaluation of the RIGHT axilla is negative for adenopathy. IMPRESSION: 1. Palpable abnormality corresponds to probably benign cyst in the 3 o'clock location of the LEFT breast. However, given the presence of internal echoes, aspiration is recommended for confirmation of a benign lesion. 2. Calcifications in the UPPER-OUTER QUADRANT of the LEFT breast are indeterminate and warrant tissue diagnosis. RECOMMENDATION: 1. Ultrasound-guided cyst aspiration of the lesion in the 3 o'clock location of the LEFT breast. 2. Stereotactic guided core biopsy of calcifications in the UPPER-OUTER QUADRANT of the LEFT breast. 3. The patient is returning later today for both procedures. I have discussed the findings and recommendations with the patient. Results were also provided in writing at the conclusion of the visit. If applicable, a reminder letter will be sent to the patient regarding the next appointment. BI-RADS CATEGORY  4: Suspicious. Electronically Signed   By: Nolon Nations M.D.   On: 10/09/2017 09:51   Mm Diag Breast Tomo Uni Left  Result Date: 10/09/2017 CLINICAL DATA:  Palpable abnormality in the LEFT breast, 3 o'clock location EXAM: DIGITAL DIAGNOSTIC LEFT MAMMOGRAM WITH CAD AND TOMO ULTRASOUND LEFT BREAST COMPARISON:  06/28/2017 and earlier ACR Breast Density Category c: The breast tissue is heterogeneously dense, which may obscure small masses. FINDINGS: Within the LATERAL mid aspect of the LEFT breast, there is an oval partially obscured mass marked with a BB as the palpable abnormality. Within the UPPER-OUTER QUADRANT of the LEFT breast there is a group of calcifications further evaluated on magnified views. On these views there is a group of calcifications measuring 0 4 x 3 millimeters. Calcifications are primarily punctate but vary in size and shape. Mammographic images were processed with CAD. On physical exam, I palpate a rounded mobile mass in the LATERAL aspect of the LEFT breast.  Targeted ultrasound is performed, showing an oval circumscribed hypoechoic mass with anechoic portions. No mobility of the internal echoes on real-time evaluation. No internal blood  flow by Doppler evaluation. Evaluation of the RIGHT axilla is negative for adenopathy. IMPRESSION: 1. Palpable abnormality corresponds to probably benign cyst in the 3 o'clock location of the LEFT breast. However, given the presence of internal echoes, aspiration is recommended for confirmation of a benign lesion. 2. Calcifications in the UPPER-OUTER QUADRANT of the LEFT breast are indeterminate and warrant tissue diagnosis. RECOMMENDATION: 1. Ultrasound-guided cyst aspiration of the lesion in the 3 o'clock location of the LEFT breast. 2. Stereotactic guided core biopsy of calcifications in the UPPER-OUTER QUADRANT of the LEFT breast. 3. The patient is returning later today for both procedures. I have discussed the findings and recommendations with the patient. Results were also provided in writing at the conclusion of the visit. If applicable, a reminder letter will be sent to the patient regarding the next appointment. BI-RADS CATEGORY  4: Suspicious. Electronically Signed   By: Nolon Nations M.D.   On: 10/09/2017 09:51   Mm Diag Breast Tomo Uni Right  Result Date: 10/14/2017 CLINICAL DATA:  57 year old patient recently diagnosed with ductal situ of the left breast. She presents for mammography of the right breast prior to initiating therapy for her left breast ductal carcinoma in situ. EXAM: DIGITAL DIAGNOSTIC UNILATERAL RIGHT MAMMOGRAM WITH CAD AND TOMO COMPARISON:  Previous exam(s). ACR Breast Density Category c: The breast tissue is heterogeneously dense, which may obscure small masses. FINDINGS: No mass, architectural distortion, or suspicious microcalcification identified in the right breast to suggest malignancy. Mammographic images were processed with CAD. IMPRESSION: No evidence of malignancy in the right breast.  RECOMMENDATION: Diagnostic mammogram is suggested in 1 year. (Code:[id number]) I have discussed the findings and recommendations with the patient. Results were also provided in writing at the conclusion of the visit. If applicable, a reminder letter will be sent to the patient regarding the next appointment. BI-RADS CATEGORY  1: Negative. Electronically Signed   By: Curlene Dolphin M.D.   On: 10/14/2017 11:30   Mm Clip Placement Left  Result Date: 10/09/2017 CLINICAL DATA:  Status post stereotactic guided core needle biopsy of a 5 mm group calcifications in the posterior aspect of the upper-outer quadrant of the left breast. EXAM: DIAGNOSTIC LEFT MAMMOGRAM POST STEREOTACTIC BIOPSY COMPARISON:  Previous exam(s). FINDINGS: Mammographic images were obtained following stereotactic guided biopsy of the recently demonstrated 5 mm group of indeterminate calcifications in the posterior aspect of the upper-outer quadrant of the left breast. These demonstrate a coil shaped biopsy marker clip at the location of the biopsied calcifications. There is one residual calcification, located 7 mm lateral and 5 mm posterior to the clip. IMPRESSION: Appropriate clip deployment following left breast stereotactic guided core needle biopsy. There is one residual calcification 7 mm lateral and 5 mm posterior to the clip. Final Assessment: Post Procedure Mammograms for Marker Placement Electronically Signed   By: Claudie Revering M.D.   On: 10/09/2017 14:03   Mm Lt Breast Bx W Loc Dev 1st Lesion Image Bx Spec Stereo Guide  Addendum Date: 10/10/2017   ADDENDUM REPORT: 10/10/2017 12:59 ADDENDUM: Pathology revealed INTERMEDIATE GRADE DUCTAL CARCINOMA IN SITU WITH CALCIFICATIONS of the Left breast, upper outer quadrant. This was found to be concordant by Dr. Claudie Revering. Pathology results were discussed with the patient by telephone. The patient reported doing well after the biopsy with tenderness at the site. Post biopsy instructions and care  were reviewed and questions were answered. The patient was encouraged to call [facility] for any additional concerns. The patient  was referred to [facility] on October 16, 2017. The patient is scheduled for a Right breast diagnostic mammogram on October 14, 2017 at [facility]. Pathology results reported by Terie Purser, RN on 10/10/2017. Electronically Signed   By: Claudie Revering M.D.   On: 10/10/2017 12:59   Result Date: 10/10/2017 CLINICAL DATA:  5 mm group of calcifications in the posterior aspect of the upper-outer left breast at recent mammography. EXAM: LEFT BREAST STEREOTACTIC CORE NEEDLE BIOPSY COMPARISON:  Previous exams. FINDINGS: The patient and I discussed the procedure of stereotactic-guided biopsy including benefits and alternatives. We discussed the high likelihood of a successful procedure. We discussed the risks of the procedure including infection, bleeding, tissue injury, clip migration, and inadequate sampling. Informed written consent was given. The usual time out protocol was performed immediately prior to the procedure. Using sterile technique and 1% Lidocaine as local anesthetic, under stereotactic guidance, a 9 gauge vacuum assisted device was used to perform core needle biopsy of the recently demonstrated 5 mm group calcifications in the upper-outer quadrant of the left breast using a lateral approach. Specimen radiograph was performed showing multiple calcifications within one of the specimens. The specimen with calcifications was identified for pathology. Lesion quadrant: Upper outer quadrant At the conclusion of the procedure, a coil shaped tissue marker clip was deployed into the biopsy cavity. Follow-up 2-view mammogram was performed and dictated separately. IMPRESSION: Stereotactic-guided biopsy of the recently demonstrated 5 mm group of calcifications in the upper-outer quadrant  of the left breast. No apparent complications. Electronically Signed: By: Claudie Revering M.D. On: 10/09/2017 13:50   US Breast Aspiration Left  Result Date: 10/09/2017 CLINICAL DATA:  2.1 cm hypoechoic mass in the 3 o'clock position of the left breast at recent ultrasound. EXAM: ULTRASOUND GUIDED LEFT BREAST CYST ASPIRATION COMPARISON:  Previous exams. PROCEDURE: Using sterile technique, 1% lidocaine, under direct ultrasound visualization, needle aspiration of the recently demonstrated 2.1 cm oval, hypoechoic mass in the 3 o'clock position of the left breast, 2 cm from the nipple was performed. This yielded 4 cc of cloudy, green colored fluid. This resulted in complete resolution of the mass. IMPRESSION: Ultrasound-guided aspiration of a 2.1 cm benign, complicated left breast cyst. No apparent complications. RECOMMENDATIONS: Await the results of a stereotactic guided core needle biopsy of the left breast performed today. Electronically Signed   By: Claudie Revering M.D.   On: 10/09/2017 14:46    ELIGIBLE FOR AVAILABLE RESEARCH PROTOCOL: not interested in COMET  ASSESSMENT: 57 y.o. Rebekah Green, Rebekah Green woman status post left breast upper outer quadrant biopsy 10/09/2017 for ductal carcinoma in situ, grade 2, estrogen and progesterone receptor negative.  (1) definitive surgery pending  (2) adjuvant radiation to follow  (3) consider prophylactic antiestrogens  (4) genetics testing scheduled for 10/18/2017  PLAN: We spent the better part of today's hour-long appointment discussing the biology of her diagnosis and the specifics of her situation. We first reviewed the fact that cancer is not one disease but more than 100 different diseases and that it is important to keep them separate-- otherwise when friends and relatives discuss their own cancer experiences with Rebekah Green confusion can result. Similarly we explained that if breast cancer spreads to the bone or liver, the patient would not have bone cancer or liver  cancer, but breast cancer in the bone and breast cancer in the liver: one cancer in three places-- not 3 different cancers which otherwise would have  to be treated in 3 different ways.  We discussed the difference between local and systemic therapy.  For ductal carcinoma in situ, the local treatment choices are surgery and radiation, and the only systemic therapy choice is antiestrogens.  Rebekah Green understands that in noninvasive ductal carcinoma, also called ductal carcinoma in situ ("DCIS") the breast cancer cells remain trapped in the ducts were they started. They cannot travel to a vital organ. For that reason these cancers in themselves are not life-threatening.  If the whole breast is removed then all the ducts are removed and since the cancer cells are trapped in the ducts, the cure rate with mastectomy for noninvasive breast cancer is approximately 99%. Nevertheless we recommend lumpectomy, because there is no survival advantage to mastectomy and because the cosmetic result is generally superior with breast conservation.  Since the patient is keeping her breasts, there will be some risk of recurrence. The recurrence can only be in the same breast since, again, the cells are trapped in the ducts. There is no connection from one breast to the other. The risk of local recurrence is cut by more than half with radiation, which is standard in this situation.  Even in estrogen receptor negative breast cancers such as tennis, anti-estrogens can be considered.  The goal would be not treatment for prevention.  Antiestrogens will lower the risk of a new breast cancer developing in either breast by approximately 50%. That risk otherwise approaches 1% per year.   Accordingly the overall plan is for surgery, followed by radiation, then a discussion of anti-estrogens.  Rebekah Green does meet criteria for genetics. In patients who carry a deleterious mutation [for example in a  BRCA gene], the risk of a new breast cancer  developing in the future may be sufficiently great that the patient may choose bilateral mastectomies. However if she wishes to keep her breasts in that situation it is safe to do so. That would require intensified screening, which generally means not only yearly mammography but a yearly breast MRI as well.  That would be the patient's clear choice if she does carry a mutation.  Rebekah Green has a good understanding of the overall plan. She agrees with it. She knows the goal of treatment in her case is cure. She will call with any problems that may develop before her next visit here.  Rebekah Green   10/16/2017 3:07 PM Medical Oncology and Hematology [facility] [address] [place], [place] [address] Tel. [phone]    Fax. [phone] '

## 2017-10-16 ENCOUNTER — Encounter: Payer: Self-pay | Admitting: Oncology

## 2017-10-16 ENCOUNTER — Inpatient Hospital Stay: Payer: 59

## 2017-10-16 ENCOUNTER — Encounter: Payer: Self-pay | Admitting: Radiation Oncology

## 2017-10-16 ENCOUNTER — Ambulatory Visit: Payer: 59 | Admitting: Physical Therapy

## 2017-10-16 ENCOUNTER — Other Ambulatory Visit: Payer: Self-pay | Admitting: General Surgery

## 2017-10-16 ENCOUNTER — Inpatient Hospital Stay: Payer: 59 | Attending: Oncology | Admitting: Oncology

## 2017-10-16 ENCOUNTER — Ambulatory Visit
Admission: RE | Admit: 2017-10-16 | Discharge: 2017-10-16 | Disposition: A | Discharge status: Home / Self Care | Payer: 59 | Source: Ambulatory Visit | Attending: Radiation Oncology | Admitting: Radiation Oncology

## 2017-10-16 VITALS — BP 134/75 | HR 80 | Temp 98.0°F | Resp 18 | Ht 65.0 in | Wt 139.7 lb

## 2017-10-16 DIAGNOSIS — D0512 Intraductal carcinoma in situ of left breast: Secondary | ICD-10-CM | POA: Insufficient documentation

## 2017-10-16 DIAGNOSIS — Z171 Estrogen receptor negative status [ER-]: Secondary | ICD-10-CM | POA: Insufficient documentation

## 2017-10-16 DIAGNOSIS — Z79899 Other long term (current) drug therapy: Secondary | ICD-10-CM | POA: Diagnosis not present

## 2017-10-16 DIAGNOSIS — C50412 Malignant neoplasm of upper-outer quadrant of left female breast: Secondary | ICD-10-CM

## 2017-10-16 DIAGNOSIS — Z807 Family history of other malignant neoplasms of lymphoid, hematopoietic and related tissues: Secondary | ICD-10-CM | POA: Diagnosis not present

## 2017-10-16 LAB — CMP (CANCER CENTER ONLY)
ALBUMIN: 4.5 g/dL (ref 3.5–5.0)
ALT: 14 U/L (ref 0–44)
ANION GAP: 8 (ref 5–15)
AST: 18 U/L (ref 15–41)
Alkaline Phosphatase: 89 U/L (ref 38–126)
BILIRUBIN TOTAL: 0.4 mg/dL (ref 0.3–1.2)
BUN: 14 mg/dL (ref 6–20)
CHLORIDE: 103 mmol/L (ref 98–111)
CO2: 30 mmol/L (ref 22–32)
Calcium: 9.5 mg/dL (ref 8.9–10.3)
Creatinine: 0.89 mg/dL (ref 0.44–1.00)
GFR, Est AFR Am: >60 mL/min (ref >60)
Glucose, Bld: 103 mg/dL — ABNORMAL HIGH (ref 70–99)
POTASSIUM: 3.9 mmol/L (ref 3.5–5.1)
Sodium: 141 mmol/L (ref 135–145)
TOTAL PROTEIN: 7.3 g/dL (ref 6.5–8.1)

## 2017-10-16 LAB — CBC WITH DIFFERENTIAL (CANCER CENTER ONLY)
BASOS PCT: 1 %
Basophils Absolute: 0 10*3/uL (ref 0.0–0.1)
EOS PCT: 1 %
Eosinophils Absolute: 0 10*3/uL (ref 0.0–0.5)
HEMATOCRIT: 43.9 % (ref 34.8–46.6)
Hemoglobin: 14.7 g/dL (ref 11.6–15.9)
Lymphocytes Relative: 38 %
Lymphs Abs: 2.3 10*3/uL (ref 0.9–3.3)
MCH: 30.3 pg (ref 25.1–34.0)
MCHC: 33.4 g/dL (ref 31.5–36.0)
MCV: 90.7 fL (ref 79.5–101.0)
MONO ABS: 0.4 10*3/uL (ref 0.1–0.9)
MONOS PCT: 7 %
NEUTROS ABS: 3.4 10*3/uL (ref 1.5–6.5)
Neutrophils Relative %: 53 %
PLATELETS: 217 10*3/uL (ref 145–400)
RBC: 4.85 MIL/uL (ref 3.70–5.45)
RDW: 12.4 % (ref 11.2–14.5)
WBC Count: 6.2 10*3/uL (ref 3.9–10.3)

## 2017-10-16 NOTE — Progress Notes (Signed)
Radiation Oncology         ([phone]) [phone] ________________________________  Initial outpatient Consultation  Name: SHAMAYA KAUER MRN: [id number]  Date: 10/16/2017  DOB: Aug 23, 1960  LK:GMWN, Micah Flesher, MD (Inactive)  [name], MD   REFERRING PHYSICIAN: [name], MD  DIAGNOSIS:    ICD-10-CM   1. Ductal carcinoma in situ (DCIS) of left breast D05.12   Cancer Staging Ductal carcinoma in situ (DCIS) of left breast Staging form: Breast, AJCC 8th Edition - Clinical stage from 10/16/2017: Stage 0 (cTis (DCIS), cN0, cM0, ER-, PR-) - Unsigned Intermediate Grade  CHIEF COMPLAINT: Here to discuss management of her left breast cancer  HISTORY OF PRESENT ILLNESS::Laquida C Arenas is a 57 y.o. female who presented with breast abnormality on the following imaging: routine screening mammography on 06/28/2017 showing no evidence of malignancy in either breast. However, she presented a palpable abnormality in the left breast   She underwent unilateral left diagnostic mammography with tomography and left breast ultrasonography at [facility] on 10/09/2017 showing: breast density category C. At the 3 o'clock position upper outer quadrant of the left breast, there is an oval partially obscured mass with grouped calcification measuring 0.4 x 0.3 cm. By ultrasound, this mass corresponded with a probably benign cyst and was hypoechoic with anechoic portions. The axilla was negative for lymphadenopathy. Unilateral right diagnostic mammography showed no evidence of malignancy in the right breast.   Accordingly on 10/09/2017 she proceeded to biopsy of the left breast calcifications, UOQ, in question. The pathology from this procedure showed ([id number]): Ductal carcinoma in situ, intermediate grade, with calcifications. Prognostic indicators significant for: both estrogen and progesterone receptor, 0% negative.  On review of systems, she reports age of first menstrual period at 10. Her last period was in  April 2017. She has carried 1 child to term, giving birth at 47. She is not currently pregnant. She endorses contraception from [facility]. She endorses a breast lump.  PREVIOUS RADIATION THERAPY: No  PAST MEDICAL HISTORY:  has no past medical history on file.    PAST SURGICAL HISTORY: Past Surgical History:  Procedure Laterality Date  . BREAST CYST ASPIRATION    . BREAST EXCISIONAL BIOPSY     unsure of breast, no scar  . MASS EXCISION  03/30/2011   Procedure: EXCISION MASS;  Surgeon: Donato Heinz;  Location: [facility];  Service: General;  Laterality: N/A;  Excision Soft Tissue Mass Abdominal Wall    FAMILY HISTORY: family history includes Breast cancer in her mother; Non-Hodgkin's lymphoma in her brother. Also, mother's aunt and a cousin (primary) on mother's side had breast cancer.  SOCIAL HISTORY:  reports that she has never smoked. She does not have any smokeless tobacco history on file. She reports that she does not drink alcohol or use drugs.  ALLERGIES: Patient has no known allergies.  MEDICATIONS:  Current Outpatient Medications  Medication Sig Dispense Refill  . Ascorbic Acid (VITAMIN C) 1000 MG tablet Take 1,000 mg by mouth daily.    . Cholecalciferol (VITAMIN D) 2000 UNITS tablet Take 2,000 Units by mouth every morning.      . Multiple Vitamins-Minerals (MULTIVITAMINS THER. W/MINERALS) TABS Take 1 tablet by mouth every morning.       No current facility-administered medications for this encounter.     REVIEW OF SYSTEMS: A 10+ POINT REVIEW OF SYSTEMS WAS OBTAINED including neurology, dermatology, psychiatry, cardiac, respiratory, lymph, extremities, GI, GU, Musculoskeletal, constitutional, breasts, reproductive, HEENT.  All pertinent positives are noted  in the HPI.  All others are negative.   PHYSICAL EXAM: Vitals with BMI 10/16/2017  Height '5\' 5"'$   Weight 139 lbs 11 oz  BMI 09.62  Systolic 836  Diastolic 75  Pulse 80  Respirations 18  General: Alert and oriented,  in no acute distress HEENT: Head is normocephalic. Extraocular movements are intact. Oropharynx is clear. Neck: Neck is supple, no palpable cervical or supraclavicular lymphadenopathy. Heart: Regular in rate and rhythm with no murmurs, rubs, or gallops. Chest: Clear to auscultation bilaterally, with no rhonchi, wheezes, or rales. Abdomen: Soft, nontender, nondistended, with no rigidity or guarding. Extremities: No cyanosis or edema. Lymphatics: see Neck Exam Skin: No concerning lesions. Musculoskeletal: symmetric strength and muscle tone throughout. Neurologic: Cranial nerves II through XII are grossly intact. No obvious focalities. Speech is fluent. Coordination is intact. Psychiatric: Judgment and insight are intact. Affect is appropriate. Breasts: 1.5 cm mass in left UOQ where her cyst was aspirated. No other palpable masses appreciated in the breasts or axillae.  ECOG = 0  0 - Asymptomatic (Fully active, able to carry on all predisease activities without restriction)  1 - Symptomatic but completely ambulatory (Restricted in physically strenuous activity but ambulatory and able to carry out work of a light or sedentary nature. For example, light housework, office work)  2 - Symptomatic, <50% in bed during the day (Ambulatory and capable of all self care but unable to carry out any work activities. Up and about more than 50% of waking hours)  3 - Symptomatic, >50% in bed, but not bedbound (Capable of only limited self-care, confined to bed or chair 50% or more of waking hours)  4 - Bedbound (Completely disabled. Cannot carry on any self-care. Totally confined to bed or chair)  5 - Death   Eustace Pen MM, Creech RH, Tormey DC, et al. [phone]). "Toxicity and response criteria of the [facility]". [place] Oncol. 5 (6): 649-55   LABORATORY DATA:  Lab Results  Component Value Date   WBC 6.2 10/16/2017   HGB 14.7 10/16/2017   HCT 43.9 10/16/2017   MCV 90.7  10/16/2017   PLT 217 10/16/2017   CMP     Component Value Date/Time   NA 141 10/16/2017 1220   K 3.9 10/16/2017 1220   CL 103 10/16/2017 1220   CO2 30 10/16/2017 1220   GLUCOSE 103 (H) 10/16/2017 1220   BUN 14 10/16/2017 1220   CREATININE 0.89 10/16/2017 1220   CALCIUM 9.5 10/16/2017 1220   PROT 7.3 10/16/2017 1220   ALBUMIN 4.5 10/16/2017 1220   AST 18 10/16/2017 1220   ALT 14 10/16/2017 1220   ALKPHOS 89 10/16/2017 1220   BILITOT 0.4 10/16/2017 1220   GFRNONAA >60 10/16/2017 1220   GFRAA >60 10/16/2017 1220         RADIOGRAPHY: US Breast Ltd Uni Left Inc Axilla  Result Date: 10/09/2017 CLINICAL DATA:  Palpable abnormality in the LEFT breast, 3 o'clock location EXAM: DIGITAL DIAGNOSTIC LEFT MAMMOGRAM WITH CAD AND TOMO ULTRASOUND LEFT BREAST COMPARISON:  06/28/2017 and earlier ACR Breast Density Category c: The breast tissue is heterogeneously dense, which may obscure small masses. FINDINGS: Within the LATERAL mid aspect of the LEFT breast, there is an oval partially obscured mass marked with a BB as the palpable abnormality. Within the UPPER-OUTER QUADRANT of the LEFT breast there is a group of calcifications further evaluated on magnified views. On these views there is a group of calcifications measuring 0 4 x 3  millimeters. Calcifications are primarily punctate but vary in size and shape. Mammographic images were processed with CAD. On physical exam, I palpate a rounded mobile mass in the LATERAL aspect of the LEFT breast. Targeted ultrasound is performed, showing an oval circumscribed hypoechoic mass with anechoic portions. No mobility of the internal echoes on real-time evaluation. No internal blood flow by Doppler evaluation. Evaluation of the RIGHT axilla is negative for adenopathy. IMPRESSION: 1. Palpable abnormality corresponds to probably benign cyst in the 3 o'clock location of the LEFT breast. However, given the presence of internal echoes, aspiration is recommended for  confirmation of a benign lesion. 2. Calcifications in the UPPER-OUTER QUADRANT of the LEFT breast are indeterminate and warrant tissue diagnosis. RECOMMENDATION: 1. Ultrasound-guided cyst aspiration of the lesion in the 3 o'clock location of the LEFT breast. 2. Stereotactic guided core biopsy of calcifications in the UPPER-OUTER QUADRANT of the LEFT breast. 3. The patient is returning later today for both procedures. I have discussed the findings and recommendations with the patient. Results were also provided in writing at the conclusion of the visit. If applicable, a reminder letter will be sent to the patient regarding the next appointment. BI-RADS CATEGORY  4: Suspicious. Electronically Signed   By: Nolon Nations M.D.   On: 10/09/2017 09:51   Mm Diag Breast Tomo Uni Left  Result Date: 10/09/2017 CLINICAL DATA:  Palpable abnormality in the LEFT breast, 3 o'clock location EXAM: DIGITAL DIAGNOSTIC LEFT MAMMOGRAM WITH CAD AND TOMO ULTRASOUND LEFT BREAST COMPARISON:  06/28/2017 and earlier ACR Breast Density Category c: The breast tissue is heterogeneously dense, which may obscure small masses. FINDINGS: Within the LATERAL mid aspect of the LEFT breast, there is an oval partially obscured mass marked with a BB as the palpable abnormality. Within the UPPER-OUTER QUADRANT of the LEFT breast there is a group of calcifications further evaluated on magnified views. On these views there is a group of calcifications measuring 0 4 x 3 millimeters. Calcifications are primarily punctate but vary in size and shape. Mammographic images were processed with CAD. On physical exam, I palpate a rounded mobile mass in the LATERAL aspect of the LEFT breast. Targeted ultrasound is performed, showing an oval circumscribed hypoechoic mass with anechoic portions. No mobility of the internal echoes on real-time evaluation. No internal blood flow by Doppler evaluation. Evaluation of the RIGHT axilla is negative for adenopathy.  IMPRESSION: 1. Palpable abnormality corresponds to probably benign cyst in the 3 o'clock location of the LEFT breast. However, given the presence of internal echoes, aspiration is recommended for confirmation of a benign lesion. 2. Calcifications in the UPPER-OUTER QUADRANT of the LEFT breast are indeterminate and warrant tissue diagnosis. RECOMMENDATION: 1. Ultrasound-guided cyst aspiration of the lesion in the 3 o'clock location of the LEFT breast. 2. Stereotactic guided core biopsy of calcifications in the UPPER-OUTER QUADRANT of the LEFT breast. 3. The patient is returning later today for both procedures. I have discussed the findings and recommendations with the patient. Results were also provided in writing at the conclusion of the visit. If applicable, a reminder letter will be sent to the patient regarding the next appointment. BI-RADS CATEGORY  4: Suspicious. Electronically Signed   By: Nolon Nations M.D.   On: 10/09/2017 09:51   Mm Diag Breast Tomo Uni Right  Result Date: 10/14/2017 CLINICAL DATA:  57 year old patient recently diagnosed with ductal situ of the left breast. She presents for mammography of the right breast prior to initiating therapy for her left breast ductal  carcinoma in situ. EXAM: DIGITAL DIAGNOSTIC UNILATERAL RIGHT MAMMOGRAM WITH CAD AND TOMO COMPARISON:  Previous exam(s). ACR Breast Density Category c: The breast tissue is heterogeneously dense, which may obscure small masses. FINDINGS: No mass, architectural distortion, or suspicious microcalcification identified in the right breast to suggest malignancy. Mammographic images were processed with CAD. IMPRESSION: No evidence of malignancy in the right breast. RECOMMENDATION: Diagnostic mammogram is suggested in 1 year. (Code:[id number]) I have discussed the findings and recommendations with the patient. Results were also provided in writing at the conclusion of the visit. If applicable, a reminder letter will be sent to the patient  regarding the next appointment. BI-RADS CATEGORY  1: Negative. Electronically Signed   By: Curlene Dolphin M.D.   On: 10/14/2017 11:30   Mm Clip Placement Left  Result Date: 10/09/2017 CLINICAL DATA:  Status post stereotactic guided core needle biopsy of a 5 mm group calcifications in the posterior aspect of the upper-outer quadrant of the left breast. EXAM: DIAGNOSTIC LEFT MAMMOGRAM POST STEREOTACTIC BIOPSY COMPARISON:  Previous exam(s). FINDINGS: Mammographic images were obtained following stereotactic guided biopsy of the recently demonstrated 5 mm group of indeterminate calcifications in the posterior aspect of the upper-outer quadrant of the left breast. These demonstrate a coil shaped biopsy marker clip at the location of the biopsied calcifications. There is one residual calcification, located 7 mm lateral and 5 mm posterior to the clip. IMPRESSION: Appropriate clip deployment following left breast stereotactic guided core needle biopsy. There is one residual calcification 7 mm lateral and 5 mm posterior to the clip. Final Assessment: Post Procedure Mammograms for Marker Placement Electronically Signed   By: Claudie Revering M.D.   On: 10/09/2017 14:03   Mm Lt Breast Bx W Loc Dev 1st Lesion Image Bx Spec Stereo Guide  Addendum Date: 10/10/2017   ADDENDUM REPORT: 10/10/2017 12:59 ADDENDUM: Pathology revealed INTERMEDIATE GRADE DUCTAL CARCINOMA IN SITU WITH CALCIFICATIONS of the Left breast, upper outer quadrant. This was found to be concordant by Dr. Claudie Revering. Pathology results were discussed with the patient by telephone. The patient reported doing well after the biopsy with tenderness at the site. Post biopsy instructions and care were reviewed and questions were answered. The patient was encouraged to call [facility] for any additional concerns. The patient was referred to [facility] at [facility] on October 16, 2017. The patient is scheduled for a Right breast diagnostic mammogram on October 14, 2017 at [facility]. Pathology results reported by Terie Purser, RN on 10/10/2017. Electronically Signed   By: Claudie Revering M.D.   On: 10/10/2017 12:59   Result Date: 10/10/2017 CLINICAL DATA:  5 mm group of calcifications in the posterior aspect of the upper-outer left breast at recent mammography. EXAM: LEFT BREAST STEREOTACTIC CORE NEEDLE BIOPSY COMPARISON:  Previous exams. FINDINGS: The patient and I discussed the procedure of stereotactic-guided biopsy including benefits and alternatives. We discussed the high likelihood of a successful procedure. We discussed the risks of the procedure including infection, bleeding, tissue injury, clip migration, and inadequate sampling. Informed written consent was given. The usual time out protocol was performed immediately prior to the procedure. Using sterile technique and 1% Lidocaine as local anesthetic, under stereotactic guidance, a 9 gauge vacuum assisted device was used to perform core needle biopsy of the recently demonstrated 5 mm group calcifications in the upper-outer quadrant of the left breast using a lateral approach. Specimen radiograph was performed showing  multiple calcifications within one of the specimens. The specimen with calcifications was identified for pathology. Lesion quadrant: Upper outer quadrant At the conclusion of the procedure, a coil shaped tissue marker clip was deployed into the biopsy cavity. Follow-up 2-view mammogram was performed and dictated separately. IMPRESSION: Stereotactic-guided biopsy of the recently demonstrated 5 mm group of calcifications in the upper-outer quadrant of the left breast. No apparent complications. Electronically Signed: By: Claudie Revering M.D. On: 10/09/2017 13:50   US Breast Aspiration Left  Result Date: 10/09/2017 CLINICAL DATA:  2.1 cm hypoechoic mass in the 3 o'clock position of the left breast at recent ultrasound.  EXAM: ULTRASOUND GUIDED LEFT BREAST CYST ASPIRATION COMPARISON:  Previous exams. PROCEDURE: Using sterile technique, 1% lidocaine, under direct ultrasound visualization, needle aspiration of the recently demonstrated 2.1 cm oval, hypoechoic mass in the 3 o'clock position of the left breast, 2 cm from the nipple was performed. This yielded 4 cc of cloudy, green colored fluid. This resulted in complete resolution of the mass. IMPRESSION: Ultrasound-guided aspiration of a 2.1 cm benign, complicated left breast cyst. No apparent complications. RECOMMENDATIONS: Await the results of a stereotactic guided core needle biopsy of the left breast performed today. Electronically Signed   By: Claudie Revering M.D.   On: 10/09/2017 14:46      IMPRESSION/PLAN: DCIS, ER-, left breast  It was a pleasure meeting the patient today. We discussed the risks, benefits, and side effects of radiotherapy. I recommend radiotherapy to the left breast to reduce her risk of locoregional recurrence by 1/2.  We discussed that radiation would take approximately 4 weeks to complete and that I would give the patient a few weeks to heal following surgery before starting treatment planning.  No guarantees of treatment were given. The patient is enthusiastic about proceeding with treatment. I look forward to participating in the patient's care.  I will await her referral back to me for postoperative follow-up and eventual CT simulation/treatment planning.  She will be referred to genetic testing. At this point, she states that she wants to undergo breast conservation regardless of the results of genetic testing, even if BRCA+ (we discussed this in detail, including risks associated w/ BRCA). She will talk to Dr. Barry Dienes about expediting her surgery to facilitate a vacation at the end of July.  She will discuss antiestrogen for further breast cancer prophylaxis with Dr. Jana Hakim.   __________________________________________   Eppie Gibson,  MD   This document serves as a record of services personally performed by Eppie Gibson, MD. It was created on his behalf by Linward Natal, a trained medical scribe. The creation of this record is based on the scribe's personal observations and the provider's statements to them. This document has been checked and approved by the attending provider.

## 2017-10-17 ENCOUNTER — Other Ambulatory Visit: Payer: Self-pay | Admitting: General Surgery

## 2017-10-17 ENCOUNTER — Encounter: Payer: Self-pay | Admitting: General Practice

## 2017-10-17 ENCOUNTER — Other Ambulatory Visit: Payer: 59

## 2017-10-17 ENCOUNTER — Telehealth: Payer: Self-pay | Admitting: Oncology

## 2017-10-17 DIAGNOSIS — C50412 Malignant neoplasm of upper-outer quadrant of left female breast: Secondary | ICD-10-CM

## 2017-10-17 DIAGNOSIS — Z171 Estrogen receptor negative status [ER-]: Principal | ICD-10-CM

## 2017-10-17 NOTE — Progress Notes (Signed)
[place] Psychosocial Distress Screening Spiritual Care  Spoke with Quynn by phone following [facility] to introduce [place] team/resources, reviewing distress screen per protocol.  The patient scored a 2 on the Psychosocial Distress Thermometer which indicates mild distress. Also assessed for distress and other psychosocial needs.   ON[facility] DISTRESS SCREENING 10/17/2017  Screening Type Initial Screening  Distress experienced in past week (1-10) 2  Referral to support programs Yes   Kennah was delighted with the practicality of [facility] and the quality of her care team. She reports relief at learning the curative nature of her care plan. She also notes that though she has a [place] address, she actually lives closer to [place], making support programming convenient for her. Shayleen is pleased that she is already being placed with an Pharmacist, hospital.   Follow up needed: No. Per pt, no needs or concerns at this time, but she is aware of ongoing Team availability and knows that I have left a full packet of [place] programming information for her to pick up from Ferol Luz at tomorrow's genetic counseling appt. Included a handwritten note of encouragement.   [place], [place], [facility] Pager [phone] Voicemail [phone]

## 2017-10-17 NOTE — Telephone Encounter (Signed)
Per 6/26 no los

## 2017-10-18 ENCOUNTER — Inpatient Hospital Stay (HOSPITAL_BASED_OUTPATIENT_CLINIC_OR_DEPARTMENT_OTHER): Payer: 59 | Admitting: Genetics

## 2017-10-18 ENCOUNTER — Encounter (HOSPITAL_BASED_OUTPATIENT_CLINIC_OR_DEPARTMENT_OTHER): Payer: Self-pay | Admitting: *Deleted

## 2017-10-18 ENCOUNTER — Inpatient Hospital Stay: Payer: 59

## 2017-10-18 ENCOUNTER — Other Ambulatory Visit: Payer: Self-pay

## 2017-10-18 ENCOUNTER — Other Ambulatory Visit: Payer: Self-pay | Admitting: *Deleted

## 2017-10-18 ENCOUNTER — Ambulatory Visit
Admission: RE | Admit: 2017-10-18 | Discharge: 2017-10-18 | Disposition: A | Discharge status: Home / Self Care | Payer: 59 | Source: Ambulatory Visit | Attending: Gynecology | Admitting: Gynecology

## 2017-10-18 ENCOUNTER — Encounter: Payer: Self-pay | Admitting: Genetics

## 2017-10-18 DIAGNOSIS — D0512 Intraductal carcinoma in situ of left breast: Secondary | ICD-10-CM

## 2017-10-18 DIAGNOSIS — E2839 Other primary ovarian failure: Secondary | ICD-10-CM

## 2017-10-18 DIAGNOSIS — Z1379 Encounter for other screening for genetic and chromosomal anomalies: Secondary | ICD-10-CM | POA: Diagnosis not present

## 2017-10-18 DIAGNOSIS — Z803 Family history of malignant neoplasm of breast: Secondary | ICD-10-CM | POA: Insufficient documentation

## 2017-10-18 DIAGNOSIS — M8580 Other specified disorders of bone density and structure, unspecified site: Secondary | ICD-10-CM

## 2017-10-18 NOTE — Progress Notes (Signed)
REFERRING PROVIDER: Chauncey Cruel, MD [address] [place], [place] [address]  PRIMARY PROVIDER:  Celene Squibb, MD  PRIMARY REASON FOR VISIT:  1. Ductal carcinoma in situ (DCIS) of left breast   2. Family history of breast cancer     HISTORY OF PRESENT ILLNESS:   Rebekah Green, a 57 y.o. female, was seen for a [facility] cancer genetics consultation at the request of Dr. Jana Hakim due to a personal and family history of breast cancer.  Rebekah Green presents to clinic today to discuss the possibility of a hereditary predisposition to cancer, genetic testing, and to further clarify her future cancer risks, as well as potential cancer risks for family members.   On 10/09/2017, at the age of 62, Rebekah Green was diagnosed with DCIS of the left breast, ER-, PR-. She is having a breast lumpectomy on 10/23/2017 and will have adjuvant radiation therapy and antiestrogen therapy.    HORMONAL RISK FACTORS:  Menarche was at age 69.  First live birth at age 19.  OCP use for approximately 5 years.  Ovaries intact: yes.  Hysterectomy: no.  Menopausal status: postmenopausal.  HRT use: 0 years. Colonoscopy: yes; normal. Mammogram within the last year: yes.   Past Medical History:  Diagnosis Date  . Family history of breast cancer     Past Surgical History:  Procedure Laterality Date  . BREAST CYST ASPIRATION    . BREAST EXCISIONAL BIOPSY     unsure of breast, no scar  . MASS EXCISION  03/30/2011   Procedure: EXCISION MASS;  Surgeon: Donato Heinz;  Location: [facility];  Service: General;  Laterality: N/A;  Excision Soft Tissue Mass Abdominal Wall    Social History   Socioeconomic History  . Marital status: Married    Spouse name: Not on file  . Number of children: Not on file  . Years of education: Not on file  . Highest education level: Not on file  Occupational History  . Not on file  Social Needs  . Financial resource strain: Not on file  . Food insecurity:    Worry: Not on  file    Inability: Not on file  . Transportation needs:    Medical: Not on file    Non-medical: Not on file  Tobacco Use  . Smoking status: Never Smoker  Substance and Sexual Activity  . Alcohol use: No  . Drug use: No  . Sexual activity: Yes    Birth control/protection: None  Lifestyle  . Physical activity:    Days per week: Not on file    Minutes per session: Not on file  . Stress: Not on file  Relationships  . Social connections:    Talks on phone: Not on file    Gets together: Not on file    Attends religious service: Not on file    Active member of club or organization: Not on file    Attends meetings of clubs or organizations: Not on file    Relationship status: Not on file  Other Topics Concern  . Not on file  Social History Narrative  . Not on file     FAMILY HISTORY:  We obtained a detailed, 4-generation family history.  Significant diagnoses are listed below: Family History  Problem Relation Age of Onset  . Breast cancer Mother 24  . Non-Hodgkin's lymphoma Brother 23       B cell  . Breast cancer Cousin 33  . Breast cancer Other  dx late 20's/ 30's.   . Anesthesia problems Neg Hx   . Hypotension Neg Hx   . Malignant hyperthermia Neg Hx   . Pseudochol deficiency Neg Hx    Rebekah Green has a 24 year-old son who has had skin cancer (removed, no further treatment) unk if melanoma. Rebekah Green also has a brother who had non-hodgkins lymphoma at 21.  He has a son and a daughter.   Rebekah Green father: 9 no history of cancer.  Paternal aunts/Uncles: 4 paternal uncles no history of cancer, 3 are deceased (died in 40s 77)   Paternal cousins: no history of cancer.  Paternal grandfather: died in old age, no history of cancer.  Paternal grandmother:died in old age, no history of cancer.   Rebekah Green mother: breast cancer dx at 36, is currently 2.  Maternal Aunts/Uncles: 3 maternal aunts with no history of cancer. 1 is 40, the others died in their 63's.   Maternal cousins: 1 maternal cousin died of breast cancer that was dx about 59 years of age Maternal grandfather: died in his 37's with no history of cancer.  Maternal grandmother:died in her 27's with no history of cancer. This grandmother had a sister (patients [place] aunt) who died of breast cancer in her late 11's/30's.   Rebekah Green is unaware of previous family history of genetic testing for hereditary cancer risks. Patient's maternal ancestors are of N. Euroepan descent, and paternal ancestors are of N. European descent. There is no reported Ashkenazi Jewish ancestry. There is no known consanguinity.  GENETIC COUNSELING ASSESSMENT: Rebekah Green is a 57 y.o. female with a personal and family history which is somewhat suggestive of a Hereditary Cancer Predisposition Syndrome. We, therefore, discussed and recommended the following at today's visit.   DISCUSSION: We reviewed the characteristics, features and inheritance patterns of hereditary cancer syndromes. We also discussed genetic testing, including the appropriate family members to test, the process of testing, insurance coverage and turn-around-time for results. We discussed the implications of a negative, positive and/or variant of uncertain significant result. We recommended Rebekah Green pursue genetic testing for the Common Hereditary Cancers gene panel.  Rebekah Green expressed that the results of this genetic testing will not impact her upcoming breast surgical decision.   The Common Hereditary Cancer Panel offered by [facility] includes sequencing and/or deletion duplication testing of the following 47 genes: APC, ATM, AXIN2, BARD1, BMPR1A, BRCA1, BRCA2, BRIP1, CDH1, CDKN2A (p14ARF), CDKN2A (p16INK4a), CKD4, CHEK2, CTNNA1, DICER1, EPCAM (Deletion/duplication testing only), GREM1 (promoter region deletion/duplication testing only), KIT, MEN1, MLH1, MSH2, MSH3, MSH6, MUTYH, NBN, NF1, NHTL1, PALB2, PDGFRA, PMS2, POLD1, POLE, PTEN, RAD50, RAD51C, RAD51D,  SDHB, SDHC, SDHD, SMAD4, SMARCA4. STK11, TP53, TSC1, TSC2, and VHL.  The following genes were evaluated for sequence changes only: SDHA and HOXB13 c.251G>A variant only.  We discussed that only 5-10% of cancers are associated with a Hereditary cancer predisposition syndrome.  One of the most common hereditary cancer syndromes that increases breast cancer risk is called Hereditary Breast and Ovarian Cancer (HBOC) syndrome.  This syndrome is caused by mutations in the BRCA1 and BRCA2 genes.  This syndrome increases an individual's lifetime risk to develop breast, ovarian, pancreatic, and other types of cancer.  There are also many other cancer predisposition syndromes caused by mutations in several other genes.  We discussed that if she is found to have a mutation in one of these genes, it may impact surgical decisions, and alter future medical management recommendations such as increased cancer  screenings and consideration of risk reducing surgeries.  A positive result could also have implications for the patient's family members.  A Negative result would mean we were unable to identify a hereditary component to her cancer, but does not rule out the possibility of a hereditary basis for her cancer.  There could be mutations that are undetectable by current technology, or in genes not yet tested or identified to increase cancer risk.    We discussed the potential to find a Variant of Uncertain Significance or VUS.  These are variants that have not yet been identified as pathogenic or benign, and it is unknown if this variant is associated with increased cancer risk or if this is a normal finding.  Most VUS's are reclassified to benign or likely benign.   It should not be used to make medical management decisions. With time, we suspect the lab will determine the significance of any VUS's identified if any.   Based on Rebekah Green's personal and family history of cancer, she meets medical criteria for genetic  testing. Despite that she meets criteria, she may still have an out of pocket cost.The laboratory can provide her with an estimate of her OOP cost.   PLAN: After considering the risks, benefits, and limitations, Rebekah Green  provided informed consent to pursue genetic testing and the blood sample was sent to [facility] for analysis of the Common Hereditary Cancers Panel. Results should be available within approximately 2-3 weeks' time, at which point they will be disclosed by telephone to Rebekah Green, as will any additional recommendations warranted by these results. Rebekah Green will receive a summary of her genetic counseling visit and a copy of her results once available. This information will also be available in Epic. We encouraged Rebekah Green to remain in contact with cancer genetics annually so that we can continuously update the family history and inform her of any changes in cancer genetics and testing that may be of benefit for her family. Rebekah Green questions were answered to her satisfaction today. Our contact information was provided should additional questions or concerns arise.  Based on Rebekah Green's family history, we recommended her maternal relatives also, have genetic counseling and testing. Ms. Vigilante will let us know if we can be of any assistance in coordinating genetic counseling and/or testing for this family member.   Lastly, we encouraged Ms. Shrout to remain in contact with cancer genetics annually so that we can continuously update the family history and inform her of any changes in cancer genetics and testing that may be of benefit for this family.   Ms.  Fischman questions were answered to her satisfaction today. Our contact information was provided should additional questions or concerns arise. Thank you for the referral and allowing [place] to share in the care of your patient.   Tana Felts, MS, [facility] Certified Genetic Counselor [email or web address] phone:  [phone]  The patient was seen for a total of 40 minutes in face-to-face genetic counseling.  The patient was accompanied today by her husband. This patient was discussed with Drs. Magrinat, Lindi Adie and/or Burr Medico who agrees with the above.

## 2017-10-21 NOTE — H&P (Signed)
Rebekah Green Documented: 10/16/2017 7:06 AM Location: [facility] Patient #: [id number] DOB: 1961-02-07 Undefined / Language: Rebekah Green / Race: White Female   History of Present Illness Rebekah Klein MD; 10/16/2017 3:48 PM) The patient is a 57 year old female who presents with breast cancer. Pt is a 57 yo F who presents for consultation at the request of Dr. Joneen Caraway for a new diagnosis of left breast cancer. She had a palpable mass, and diagnostic imaging revealed the mass to be a cyst, but it showed 5 mm of calcifications in the upper outer quadrant. Breast density was c. Core needle biopsy of the calcifications showed Grade 2 DCIS, ER/PR negative. the cyst was aspirated to completion.   Of note, she has a significant family history of breast cancer in her mother, maternal cousin, and a maternal great aunt. Her cousin had cancer in her 18s and her great aunt had cancer in her 59s. her mom was in her 55s. She is an Glass blower/designer. She is a non spoker and drinks less than 1 drink per week. She is up to date on colonoscopy, pap smear, and bone density.    dx mammogram/us 10/09/17 ACR Breast Density Category c: The breast tissue is heterogeneously dense, which may obscure small masses.  FINDINGS: Within the LATERAL mid aspect of the LEFT breast, there is an oval partially obscured mass marked with a BB as the palpable abnormality. Within the UPPER-OUTER QUADRANT of the LEFT breast there is a group of calcifications further evaluated on magnified views. On these views there is a group of calcifications measuring 0 4 x 3 millimeters. Calcifications are primarily punctate but vary in size and shape.  Mammographic images were processed with CAD.  On physical exam, I palpate a rounded mobile mass in the LATERAL aspect of the LEFT breast.  Targeted ultrasound is performed, showing an oval circumscribed hypoechoic mass with anechoic portions. No mobility of the internal echoes  on real-time evaluation. No internal blood flow by Doppler evaluation. Evaluation of the RIGHT axilla is negative for adenopathy.  IMPRESSION: 1. Palpable abnormality corresponds to probably benign cyst in the 3 o'clock location of the LEFT breast. However, given the presence of internal echoes, aspiration is recommended for confirmation of a benign lesion. 2. Calcifications in the UPPER-OUTER QUADRANT of the LEFT breast are indeterminate and warrant tissue diagnosis.  RECOMMENDATION: 1. Ultrasound-guided cyst aspiration of the lesion in the 3 o'clock location of the LEFT breast. 2. Stereotactic guided core biopsy of calcifications in the UPPER-OUTER QUADRANT of the LEFT breast. 3. The patient is returning later today for both procedures.  I have discussed the findings and recommendations with the patient. Results were also provided in writing at the conclusion of the visit. If applicable, a reminder letter will be sent to the patient regarding the next appointment.  BI-RADS CATEGORY 4: Suspicious.   pathology 10/09/17 Diagnosis Breast, left, needle core biopsy, UOQ - DUCTAL CARCINOMA IN SITU WITH CALCIFICATIONS. - SEE COMMENT. Microscopic Comment The carcinoma appears intermediate grade. IMMUNOHISTOCHEMICAL AND MORPHOMETRIC ANALYSIS PERFORMED MANUALLY Estrogen Receptor: 0%, NEGATIVE Progesterone Receptor: 0%, NEGATIVE  CMET and CBC were essentially normal.    Past Surgical History Rebekah Klein, MD; 10/16/2017 7:07 AM) Breast Biopsy  Left.  Diagnostic Studies History Rebekah Klein, MD; 10/16/2017 7:07 AM) Colonoscopy  5-10 years ago Mammogram  within last year Pap Smear  1-5 years ago  Social History Rebekah Klein, MD; 10/16/2017 7:07 AM) Alcohol use  Occasional alcohol use. Caffeine use  Coffee. No drug use  Tobacco use  Never smoker.  Family History Rebekah Klein, MD; 10/16/2017 7:07 AM) Arthritis  Father. Breast Cancer  Mother. Cancer   Brother.    Review of Systems Rebekah Klein MD; 10/16/2017 7:07 AM) General Not Present- Appetite Loss, Chills, Fatigue, Fever, Night Sweats, Weight Gain and Weight Loss. Skin Not Present- Change in Wart/Mole, Dryness, Hives, Jaundice, New Lesions, Non-Healing Wounds, Rash and Ulcer. HEENT Present- Wears glasses/contact lenses. Not Present- Earache, Hearing Loss, Hoarseness, Nose Bleed, Oral Ulcers, Ringing in the Ears, Seasonal Allergies, Sinus Pain, Sore Throat, Visual Disturbances and Yellow Eyes. Respiratory Not Present- Bloody sputum, Chronic Cough, Difficulty Breathing, Snoring and Wheezing. Breast Present- Breast Mass. Not Present- Breast Pain, Nipple Discharge and Skin Changes. Cardiovascular Not Present- Chest Pain, Difficulty Breathing Lying Down, Leg Cramps, Palpitations, Rapid Heart Rate, Shortness of Breath and Swelling of Extremities. Gastrointestinal Not Present- Abdominal Pain, Bloating, Bloody Stool, Change in Bowel Habits, Chronic diarrhea, Constipation, Difficulty Swallowing, Excessive gas, Gets full quickly at meals, Hemorrhoids, Indigestion, Nausea, Rectal Pain and Vomiting. Female Genitourinary Not Present- Frequency, Nocturia, Painful Urination, Pelvic Pain and Urgency. Musculoskeletal Not Present- Back Pain, Joint Pain, Joint Stiffness, Muscle Pain, Muscle Weakness and Swelling of Extremities. Neurological Not Present- Decreased Memory, Fainting, Headaches, Numbness, Seizures, Tingling, Tremor, Trouble walking and Weakness. Psychiatric Not Present- Anxiety, Bipolar, Change in Sleep Pattern, Depression, Fearful and Frequent crying. Endocrine Not Present- Cold Intolerance, Excessive Hunger, Hair Changes, Heat Intolerance, Hot flashes and New Diabetes. Hematology Not Present- Blood Thinners, Easy Bruising, Excessive bleeding, Gland problems, HIV and Persistent Infections.  Vitals Rebekah Klein MD; 10/16/2017 3:49 PM) 10/16/2017 3:49 PM Weight: 139.7 lb Height: 65in Body  Surface Area: 1.7 m Body Mass Index: 23.25 kg/m  Temp.: 83F  Pulse: 80 (Regular)  Resp.: 18 (Unlabored)  BP: 134/75 (Sitting, Left Arm, Standard)       Physical Exam Rebekah Klein MD; 10/16/2017 3:50 PM) General Mental Status-Alert. General Appearance-Consistent with stated age. Hydration-Well hydrated. Voice-Normal.  Head and Neck Head-normocephalic, atraumatic with no lesions or palpable masses. Trachea-midline. Thyroid Gland Characteristics - normal size and consistency.  Eye Eyeball - Bilateral-Extraocular movements intact. Sclera/Conjunctiva - Bilateral-No scleral icterus.  Chest and Lung Exam Chest and lung exam reveals -quiet, even and easy respiratory effort with no use of accessory muscles and on auscultation, normal breath sounds, no adventitious sounds and normal vocal resonance. Inspection Chest Wall - Normal. Back - normal.  Breast Note: tiny bruise in the UOQ at biopsy site. no palpable masses. no skin dimpling. no nipple discharge. no nipple retraction. No LAD. right breast normal as well. breasts symmetric a/b cup. heterogeneously dense.   Cardiovascular Cardiovascular examination reveals -normal heart sounds, regular rate and rhythm with no murmurs and normal pedal pulses bilaterally.  Abdomen Inspection Inspection of the abdomen reveals - No Hernias. Palpation/Percussion Palpation and Percussion of the abdomen reveal - Soft, Non Tender, No Rebound tenderness, No Rigidity (guarding) and No hepatosplenomegaly. Auscultation Auscultation of the abdomen reveals - Bowel sounds normal.  Neurologic Neurologic evaluation reveals -alert and oriented x 3 with no impairment of recent or remote memory. Mental Status-Normal.  Musculoskeletal Global Assessment -Note: no gross deformities.  Normal Exam - Left-Upper Extremity Strength Normal and Lower Extremity Strength Normal. Normal Exam - Right-Upper Extremity  Strength Normal and Lower Extremity Strength Normal.  Lymphatic Head & Neck  General Head & Neck Lymphatics: Bilateral - Description - Normal. Axillary  General Axillary Region: Bilateral - Description - Normal. Tenderness - Non Tender. Femoral &  Inguinal  Generalized Femoral & Inguinal Lymphatics: Bilateral - Description - No Generalized lymphadenopathy.    Assessment & Plan Rebekah Klein MD; 10/16/2017 3:51 PM) MALIGNANT NEOPLASM OF UPPER-OUTER QUADRANT OF LEFT BREAST IN FEMALE, ESTROGEN RECEPTOR NEGATIVE (C50.412) Impression: Pt has a new diagnosis of left breast cancer, cTis. Will plan seed localized lumpectomy. Palpable mass was cyst.  We will send her for genetic testing due to her cancer history. She still wants a lumpectomy regardless of results.  The surgical procedure was described to the patient. I discussed the incision type and location and that we would need radiology involved on with a wire or seed marker and/or sentinel node.  The risks and benefits of the procedure were described to the patient and she wishes to proceed.  We discussed the risks bleeding, infection, damage to other structures, need for further procedures/surgeries. We discussed the risk of seroma. The patient was advised if the area in the breast in cancer, we may need to go back to surgery for additional tissue to obtain negative margins or for a lymph node biopsy. The patient was advised that these are the most common complications, but that others can occur as well. They were advised against taking aspirin or other anti-inflammatory agents/blood thinners the week before surgery. Current Plans You are being scheduled for surgery- Our schedulers will call you.  You should hear from our office's scheduling department within 5 working days about the location, date, and time of surgery. We try to make accommodations for patient's preferences in scheduling surgery, but sometimes the OR schedule or the  surgeon's schedule prevents [place] from making those accommodations.  If you have not heard from our office [phone]) in 5 working days, call the office and ask for your surgeon's nurse.  If you have other questions about your diagnosis, plan, or surgery, call the office and ask for your surgeon's nurse.  Pt Education - [facility] Breast Cancer Information Given - [facility] "Breast Journey" Package Referred to Genetic Counseling, for evaluation and follow up [employer]). Routine.   Signed by Rebekah Klein, MD (10/16/2017 3:52 PM)

## 2017-10-22 ENCOUNTER — Ambulatory Visit
Admission: RE | Admit: 2017-10-22 | Discharge: 2017-10-22 | Disposition: A | Discharge status: Home / Self Care | Payer: 59 | Source: Ambulatory Visit | Attending: General Surgery | Admitting: General Surgery

## 2017-10-22 ENCOUNTER — Encounter: Payer: Self-pay | Admitting: Radiation Oncology

## 2017-10-22 DIAGNOSIS — C50412 Malignant neoplasm of upper-outer quadrant of left female breast: Secondary | ICD-10-CM

## 2017-10-22 DIAGNOSIS — Z171 Estrogen receptor negative status [ER-]: Principal | ICD-10-CM

## 2017-10-22 NOTE — Progress Notes (Signed)
Ensure pre surgery drink given with instructions to complete by 0530 dos, pt verbalized understanding. 

## 2017-10-23 ENCOUNTER — Encounter (HOSPITAL_BASED_OUTPATIENT_CLINIC_OR_DEPARTMENT_OTHER)
Admission: RE | Disposition: A | Discharge status: Home / Self Care | Payer: Self-pay | Source: Ambulatory Visit | Attending: General Surgery

## 2017-10-23 ENCOUNTER — Ambulatory Visit (HOSPITAL_BASED_OUTPATIENT_CLINIC_OR_DEPARTMENT_OTHER): Payer: 59 | Admitting: Anesthesiology

## 2017-10-23 ENCOUNTER — Other Ambulatory Visit: Payer: Self-pay

## 2017-10-23 ENCOUNTER — Ambulatory Visit
Admission: RE | Admit: 2017-10-23 | Discharge: 2017-10-23 | Disposition: A | Discharge status: Home / Self Care | Payer: 59 | Source: Ambulatory Visit | Attending: General Surgery | Admitting: General Surgery

## 2017-10-23 ENCOUNTER — Encounter (HOSPITAL_BASED_OUTPATIENT_CLINIC_OR_DEPARTMENT_OTHER): Payer: Self-pay | Admitting: Certified Registered"

## 2017-10-23 ENCOUNTER — Ambulatory Visit (HOSPITAL_BASED_OUTPATIENT_CLINIC_OR_DEPARTMENT_OTHER)
Admission: RE | Admit: 2017-10-23 | Discharge: 2017-10-23 | Disposition: A | Discharge status: Home / Self Care | Payer: 59 | Source: Ambulatory Visit | Attending: General Surgery | Admitting: General Surgery

## 2017-10-23 DIAGNOSIS — C50412 Malignant neoplasm of upper-outer quadrant of left female breast: Secondary | ICD-10-CM

## 2017-10-23 DIAGNOSIS — Z171 Estrogen receptor negative status [ER-]: Secondary | ICD-10-CM | POA: Diagnosis not present

## 2017-10-23 DIAGNOSIS — Z803 Family history of malignant neoplasm of breast: Secondary | ICD-10-CM | POA: Insufficient documentation

## 2017-10-23 HISTORY — DX: Malignant (primary) neoplasm, unspecified: C80.1

## 2017-10-23 HISTORY — PX: BREAST LUMPECTOMY WITH RADIOACTIVE SEED LOCALIZATION: SHX6424

## 2017-10-23 SURGERY — BREAST LUMPECTOMY WITH RADIOACTIVE SEED LOCALIZATION
Anesthesia: General | Site: Breast | Laterality: Left

## 2017-10-23 MED ORDER — LIDOCAINE-EPINEPHRINE (PF) 1 %-1:200000 IJ SOLN
INTRAMUSCULAR | Status: DC | PRN
  Administered 2017-10-23: 20 mL via INTRAMUSCULAR

## 2017-10-23 MED ORDER — LIDOCAINE HCL (CARDIAC) PF 100 MG/5ML IV SOSY
PREFILLED_SYRINGE | INTRAVENOUS | Status: DC | PRN
  Administered 2017-10-23: 60 mg via INTRAVENOUS

## 2017-10-23 MED ORDER — MEPERIDINE HCL 25 MG/ML IJ SOLN: 6.2500 mg | INTRAMUSCULAR | Status: DC | PRN

## 2017-10-23 MED ORDER — CELECOXIB 200 MG PO CAPS
200.0000 mg | ORAL_CAPSULE | ORAL | Status: AC
  Administered 2017-10-23: 200 mg via ORAL

## 2017-10-23 MED ORDER — OXYCODONE HCL 5 MG PO TABS: 5.0000 mg | ORAL_TABLET | Freq: Four times a day (QID) | ORAL | 0 refills | Status: DC | PRN

## 2017-10-23 MED ORDER — OXYCODONE HCL 5 MG PO TABS: 5.0000 mg | ORAL_TABLET | Freq: Once | ORAL | Status: DC | PRN

## 2017-10-23 MED ORDER — FENTANYL CITRATE (PF) 100 MCG/2ML IJ SOLN
INTRAMUSCULAR | Status: AC
  Filled 2017-10-23: qty 2

## 2017-10-23 MED ORDER — ONDANSETRON HCL 4 MG/2ML IJ SOLN
INTRAMUSCULAR | Status: DC | PRN
  Administered 2017-10-23: 4 mg via INTRAVENOUS

## 2017-10-23 MED ORDER — BUPIVACAINE-EPINEPHRINE (PF) 0.5% -1:200000 IJ SOLN
INTRAMUSCULAR | Status: AC
  Filled 2017-10-23: qty 30

## 2017-10-23 MED ORDER — MIDAZOLAM HCL 2 MG/2ML IJ SOLN
INTRAMUSCULAR | Status: AC
  Filled 2017-10-23: qty 2

## 2017-10-23 MED ORDER — FENTANYL CITRATE (PF) 100 MCG/2ML IJ SOLN: 25.0000 ug | INTRAMUSCULAR | Status: DC | PRN

## 2017-10-23 MED ORDER — ACETAMINOPHEN 500 MG PO TABS
ORAL_TABLET | ORAL | Status: AC
  Filled 2017-10-23: qty 2

## 2017-10-23 MED ORDER — BUPIVACAINE-EPINEPHRINE (PF) 0.25% -1:200000 IJ SOLN
INTRAMUSCULAR | Status: AC
  Filled 2017-10-23: qty 30

## 2017-10-23 MED ORDER — LIDOCAINE HCL (PF) 1 % IJ SOLN
INTRAMUSCULAR | Status: AC
Start: 2017-10-23 — End: ?
  Filled 2017-10-23: qty 60

## 2017-10-23 MED ORDER — PROMETHAZINE HCL 25 MG/ML IJ SOLN: 6.2500 mg | INTRAMUSCULAR | Status: DC | PRN

## 2017-10-23 MED ORDER — CEFAZOLIN SODIUM-DEXTROSE 2-4 GM/100ML-% IV SOLN
2.0000 g | INTRAVENOUS | Status: AC
  Administered 2017-10-23: 2 g via INTRAVENOUS

## 2017-10-23 MED ORDER — ACETAMINOPHEN 500 MG PO TABS
1000.0000 mg | ORAL_TABLET | ORAL | Status: AC
  Administered 2017-10-23: 1000 mg via ORAL

## 2017-10-23 MED ORDER — DEXAMETHASONE SODIUM PHOSPHATE 4 MG/ML IJ SOLN
INTRAMUSCULAR | Status: DC | PRN
  Administered 2017-10-23: 10 mg via INTRAVENOUS

## 2017-10-23 MED ORDER — CHLORHEXIDINE GLUCONATE CLOTH 2 % EX PADS: 6.0000 | MEDICATED_PAD | Freq: Once | CUTANEOUS | Status: DC

## 2017-10-23 MED ORDER — GABAPENTIN 300 MG PO CAPS
300.0000 mg | ORAL_CAPSULE | ORAL | Status: AC
  Administered 2017-10-23: 300 mg via ORAL

## 2017-10-23 MED ORDER — FENTANYL CITRATE (PF) 100 MCG/2ML IJ SOLN
50.0000 ug | INTRAMUSCULAR | Status: DC | PRN
  Administered 2017-10-23 (×2): 50 ug via INTRAVENOUS

## 2017-10-23 MED ORDER — PROPOFOL 10 MG/ML IV BOLUS
INTRAVENOUS | Status: DC | PRN
  Administered 2017-10-23: 100 mg via INTRAVENOUS

## 2017-10-23 MED ORDER — GABAPENTIN 300 MG PO CAPS
ORAL_CAPSULE | ORAL | Status: AC
  Filled 2017-10-23: qty 1

## 2017-10-23 MED ORDER — CELECOXIB 200 MG PO CAPS
ORAL_CAPSULE | ORAL | Status: AC
  Filled 2017-10-23: qty 1

## 2017-10-23 MED ORDER — LACTATED RINGERS IV SOLN
INTRAVENOUS | Status: DC
  Administered 2017-10-23 (×2): via INTRAVENOUS

## 2017-10-23 MED ORDER — OXYCODONE HCL 5 MG/5ML PO SOLN: 5.0000 mg | Freq: Once | ORAL | Status: DC | PRN

## 2017-10-23 MED ORDER — BUPIVACAINE HCL (PF) 0.5 % IJ SOLN
INTRAMUSCULAR | Status: AC
  Filled 2017-10-23: qty 30

## 2017-10-23 MED ORDER — MIDAZOLAM HCL 2 MG/2ML IJ SOLN
1.0000 mg | INTRAMUSCULAR | Status: DC | PRN
  Administered 2017-10-23: 2 mg via INTRAVENOUS

## 2017-10-23 MED ORDER — CEFAZOLIN SODIUM-DEXTROSE 2-4 GM/100ML-% IV SOLN
INTRAVENOUS | Status: AC
  Filled 2017-10-23: qty 100

## 2017-10-23 MED ORDER — SCOPOLAMINE 1 MG/3DAYS TD PT72: 1.0000 | MEDICATED_PATCH | Freq: Once | TRANSDERMAL | Status: DC | PRN

## 2017-10-23 MED ORDER — LIDOCAINE-EPINEPHRINE 1 %-1:100000 IJ SOLN
INTRAMUSCULAR | Status: AC
  Filled 2017-10-23: qty 1

## 2017-10-23 SURGICAL SUPPLY — 51 items
BINDER BREAST LRG (GAUZE/BANDAGES/DRESSINGS) IMPLANT
BINDER BREAST MEDIUM (GAUZE/BANDAGES/DRESSINGS) IMPLANT
BLADE SURG 10 STRL SS (BLADE) ×3 IMPLANT
CANISTER SUC SOCK COL 7IN (MISCELLANEOUS) IMPLANT
CANISTER SUCT 1200ML W/VALVE (MISCELLANEOUS) ×3 IMPLANT
CHLORAPREP W/TINT 26ML (MISCELLANEOUS) ×3 IMPLANT
CLIP VESOCCLUDE LG 6/CT (CLIP) ×3 IMPLANT
CLOSURE WOUND 1/2 X4 (GAUZE/BANDAGES/DRESSINGS) ×1
COVER BACK TABLE 60X90IN (DRAPES) ×3 IMPLANT
COVER MAYO STAND STRL (DRAPES) ×3 IMPLANT
COVER PROBE W GEL 5X96 (DRAPES) ×3 IMPLANT
DECANTER SPIKE VIAL GLASS SM (MISCELLANEOUS) IMPLANT
DERMABOND ADVANCED (GAUZE/BANDAGES/DRESSINGS) ×2
DERMABOND ADVANCED .7 DNX12 (GAUZE/BANDAGES/DRESSINGS) ×1 IMPLANT
DEVICE DUBIN W/COMP PLATE 8390 (MISCELLANEOUS) ×3 IMPLANT
DRAPE LAPAROSCOPIC ABDOMINAL (DRAPES) ×3 IMPLANT
DRAPE UTILITY XL STRL (DRAPES) ×3 IMPLANT
ELECT COATED BLADE 2.86 ST (ELECTRODE) ×3 IMPLANT
ELECT REM PT RETURN 9FT ADLT (ELECTROSURGICAL) ×3
ELECTRODE REM PT RTRN 9FT ADLT (ELECTROSURGICAL) ×1 IMPLANT
GAUZE SPONGE 4X4 12PLY STRL LF (GAUZE/BANDAGES/DRESSINGS) ×3 IMPLANT
GLOVE BIO SURGEON STRL SZ 6 (GLOVE) ×6 IMPLANT
GLOVE BIO SURGEON STRL SZ 6.5 (GLOVE) ×2 IMPLANT
GLOVE BIO SURGEONS STRL SZ 6.5 (GLOVE) ×1
GLOVE BIOGEL PI IND STRL 6.5 (GLOVE) ×1 IMPLANT
GLOVE BIOGEL PI IND STRL 7.0 (GLOVE) ×1 IMPLANT
GLOVE BIOGEL PI INDICATOR 6.5 (GLOVE) ×2
GLOVE BIOGEL PI INDICATOR 7.0 (GLOVE) ×2
GOWN STRL REUS W/ TWL LRG LVL3 (GOWN DISPOSABLE) ×1 IMPLANT
GOWN STRL REUS W/TWL 2XL LVL3 (GOWN DISPOSABLE) ×3 IMPLANT
GOWN STRL REUS W/TWL LRG LVL3 (GOWN DISPOSABLE) ×2
KIT MARKER MARGIN INK (KITS) ×3 IMPLANT
LIGHT WAVEGUIDE WIDE FLAT (MISCELLANEOUS) IMPLANT
NEEDLE HYPO 25X1 1.5 SAFETY (NEEDLE) ×3 IMPLANT
NS IRRIG 1000ML POUR BTL (IV SOLUTION) ×3 IMPLANT
PACK BASIN DAY SURGERY FS (CUSTOM PROCEDURE TRAY) ×3 IMPLANT
PENCIL BUTTON HOLSTER BLD 10FT (ELECTRODE) ×3 IMPLANT
SLEEVE SCD COMPRESS KNEE MED (MISCELLANEOUS) ×3 IMPLANT
SPONGE LAP 18X18 RF (DISPOSABLE) ×3 IMPLANT
STRIP CLOSURE SKIN 1/2X4 (GAUZE/BANDAGES/DRESSINGS) ×2 IMPLANT
SUT MNCRL AB 4-0 PS2 18 (SUTURE) ×3 IMPLANT
SUT SILK 2 0 SH (SUTURE) IMPLANT
SUT VIC AB 2-0 SH 27 (SUTURE) ×2
SUT VIC AB 2-0 SH 27XBRD (SUTURE) ×1 IMPLANT
SUT VIC AB 3-0 SH 27 (SUTURE) ×2
SUT VIC AB 3-0 SH 27X BRD (SUTURE) ×1 IMPLANT
SYR CONTROL 10ML LL (SYRINGE) ×3 IMPLANT
TOWEL GREEN STERILE FF (TOWEL DISPOSABLE) ×3 IMPLANT
TUBE CONNECTING 20'X1/4 (TUBING) ×1
TUBE CONNECTING 20X1/4 (TUBING) ×2 IMPLANT
YANKAUER SUCT BULB TIP NO VENT (SUCTIONS) ×3 IMPLANT

## 2017-10-23 NOTE — Anesthesia Procedure Notes (Signed)
Procedure Name: LMA Insertion Date/Time: 10/23/2017 9:10 AM Performed by: Signe Colt, CRNA Pre-anesthesia Checklist: Patient identified, Emergency Drugs available, Suction available and Patient being monitored Patient Re-evaluated:Patient Re-evaluated prior to induction Oxygen Delivery Method: Circle system utilized Preoxygenation: Pre-oxygenation with 100% oxygen Induction Type: IV induction Ventilation: Mask ventilation without difficulty LMA: LMA inserted LMA Size: 4.0 Number of attempts: 1 Airway Equipment and Method: Bite block Placement Confirmation: positive ETCO2 Tube secured with: Tape Dental Injury: Teeth and Oropharynx as per pre-operative assessment

## 2017-10-23 NOTE — Op Note (Signed)
Left Breast Radioactive seed localized lumpectomy  Indications: This patient presents with history of left breast cancer, cTis, upper outer quadrant,  ER/PR -   Pre-operative Diagnosis: left breast cancer, stage 0  Post-operative Diagnosis: Same  Surgeon: [name]   Anesthesia: General endotracheal anesthesia  ASA Class: 1  Procedure Details  The patient was seen in the Holding Room. The risks, benefits, complications, treatment options, and expected outcomes were discussed with the patient. The possibilities of bleeding, infection, the need for additional procedures, failure to diagnose a condition, and creating a complication requiring transfusion or operation were discussed with the patient. The patient concurred with the proposed plan, giving informed consent.  The site of surgery properly noted/marked. The patient was taken to Operating Room # 2, identified, and the procedure verified as Left Breast seed localized Lumpectomy. A Time Out was held and the above information confirmed.  The left breast and chest were prepped and draped in standard fashion. The lumpectomy was performed by creating an oblique incision over the upper outer quadrant of the breast over the previously placed radioactive seed.  Dissection was carried down to around the point of maximum signal intensity. The cautery was used to perform the dissection.  Hemostasis was achieved with cautery. The edges of the cavity were marked with large clips, with one each medial, lateral, inferior and superior, and two clips posteriorly.   The specimen was inked with the margin marker paint kit.    Specimen radiography confirmed inclusion of the mammographic lesion, the clip, and the seed.  The background signal in the breast was zero.  The wound was irrigated and closed with 3-0 vicryl in layers and 4-0 monocryl subcuticular suture.      Sterile dressings were applied. At the end of the operation, all sponge, instrument, and needle  counts were correct.  Findings: grossly clear surgical margins and no adenopathy  Estimated Blood Loss:  min         Specimens: left breast lumpectomy.           Complications:  None; patient tolerated the procedure well.         Disposition: PACU - hemodynamically stable.         Condition: stable

## 2017-10-23 NOTE — Transfer of Care (Signed)
Immediate Anesthesia Transfer of Care Note  Patient: Rebekah Green  Procedure(s) Performed: BREAST LUMPECTOMY WITH RADIOACTIVE SEED LOCALIZATION (Left Breast)  Patient Location: PACU  Anesthesia Type:General  Level of Consciousness: awake and patient cooperative  Airway & Oxygen Therapy: Patient Spontanous Breathing and Patient connected to face mask oxygen  Post-op Assessment: Report given to RN and Post -op Vital signs reviewed and stable  Post vital signs: Reviewed and stable  Last Vitals:  Vitals Value Taken Time  BP    Temp    Pulse 65 10/23/2017 10:10 AM  Resp    SpO2 99 % 10/23/2017 10:10 AM  Vitals shown include unvalidated device data.  Last Pain:  Vitals:   10/23/17 0750  TempSrc: Oral         Complications: No apparent anesthesia complications

## 2017-10-23 NOTE — Anesthesia Preprocedure Evaluation (Signed)
Anesthesia Evaluation  Patient identified by MRN, date of birth, ID band Patient awake    Reviewed: Allergy & Precautions, H&P , NPO status , Patient's Chart, lab work & pertinent test results  Airway Mallampati: I  TM Distance: >3 FB Neck ROM: Full    Dental no notable dental hx.    Pulmonary neg pulmonary ROS,    Pulmonary exam normal        Cardiovascular negative cardio ROS   Rhythm:Regular Rate:Normal     Neuro/Psych negative neurological ROS  negative psych ROS   GI/Hepatic negative GI ROS, Neg liver ROS,   Endo/Other  negative endocrine ROS  Renal/GU negative Renal ROS  negative genitourinary   Musculoskeletal negative musculoskeletal ROS (+)   Abdominal Normal abdominal exam  (+)   Peds  Hematology negative hematology ROS (+)   Anesthesia Other Findings   Reproductive/Obstetrics negative OB ROS                             Anesthesia Physical  Anesthesia Plan  ASA: I  Anesthesia Plan: General   Post-op Pain Management:    Induction: Intravenous  PONV Risk Score and Plan: 4 or greater and Dexamethasone, Ondansetron, Midazolam and Scopolamine patch - Pre-op  Airway Management Planned: LMA  Additional Equipment:   Intra-op Plan:   Post-operative Plan: Extubation in OR  Informed Consent: I have reviewed the patients History and Physical, chart, labs and discussed the procedure including the risks, benefits and alternatives for the proposed anesthesia with the patient or authorized representative who has indicated his/her understanding and acceptance.     Plan Discussed with: CRNA  Anesthesia Plan Comments:         Anesthesia Quick Evaluation

## 2017-10-23 NOTE — Interval H&P Note (Signed)
History and Physical Interval Note:  10/23/2017 8:55 AM  Rebekah Green  has presented today for surgery, with the diagnosis of LEFT BREAST CANCER  The various methods of treatment have been discussed with the patient and family. After consideration of risks, benefits and other options for treatment, the patient has consented to  Procedure(s): BREAST LUMPECTOMY WITH RADIOACTIVE SEED LOCALIZATION (Left) as a surgical intervention .  The patient's history has been reviewed, patient examined, no change in status, stable for surgery.  I have reviewed the patient's chart and labs.  Questions were answered to the patient's satisfaction.     [name]

## 2017-10-23 NOTE — Discharge Instructions (Addendum)
[place] Office Phone Number [phone]  BREAST BIOPSY/ PARTIAL MASTECTOMY: POST OP INSTRUCTIONS  Always review your discharge instruction sheet given to you by the facility where your surgery was performed.  IF YOU HAVE DISABILITY OR FAMILY LEAVE FORMS, YOU MUST BRING THEM TO THE OFFICE FOR PROCESSING.  DO NOT GIVE THEM TO YOUR DOCTOR.  1. A prescription for pain medication may be given to you upon discharge.  Take your pain medication as prescribed, if needed.  If narcotic pain medicine is not needed, then you may take acetaminophen (Tylenol) or ibuprofen (Advil) as needed. 2. Take your usually prescribed medications unless otherwise directed 3. If you need a refill on your pain medication, please contact your pharmacy.  They will contact our office to request authorization.  Prescriptions will not be filled after 5pm or on week-ends. 4. You should eat very light the first 24 hours after surgery, such as soup, crackers, pudding, etc.  Resume your normal diet the day after surgery. 5. Most patients will experience some swelling and bruising in the breast.  Ice packs and a good support bra will help.  Swelling and bruising can take several days to resolve.  6. It is common to experience some constipation if taking pain medication after surgery.  Increasing fluid intake and taking a stool softener will usually help or prevent this problem from occurring.  A mild laxative (Milk of Magnesia or Miralax) should be taken according to package directions if there are no bowel movements after 48 hours. 7. Unless discharge instructions indicate otherwise, you may remove your bandages 48 hours after surgery, and you may shower at that time.  You may have steri-strips (small skin tapes) in place directly over the incision.  These strips should be left on the skin for 7-10 days.   Any sutures or staples will be removed at the office during your follow-up visit. 8. ACTIVITIES:  You may resume  regular daily activities (gradually increasing) beginning the next day.  Wearing a good support bra or sports bra (or the breast binder) minimizes pain and swelling.  You may have sexual intercourse when it is comfortable. a. You may drive when you no longer are taking prescription pain medication, you can comfortably wear a seatbelt, and you can safely maneuver your car and apply brakes. b. RETURN TO WORK:  __________1 week_______________ 9. You should see your doctor in the office for a follow-up appointment approximately two weeks after your surgery.  Your doctors nurse will typically make your follow-up appointment when she calls you with your pathology report.  Expect your pathology report 2-3 business days after your surgery.  You may call to check if you do not hear from [place] after three days.   WHEN TO CALL YOUR DOCTOR: 1. Fever over 101.0 2. Nausea and/or vomiting. 3. Extreme swelling or bruising. 4. Continued bleeding from incision. 5. Increased pain, redness, or drainage from the incision.  The clinic staff is available to answer your questions during regular business hours.  Please dont hesitate to call and ask to speak to one of the nurses for clinical concerns.  If you have a medical emergency, go to the nearest emergency room or call 911.  A surgeon from [facility] is always on call at the hospital.  For further questions, please visit [email or web address]     Tylenol 1,000mg  given today at [place] Instructions  Activity: Get plenty of rest for the remainder of the  day. A responsible individual must stay with you for 24 hours following the procedure.  For the next 24 hours, DO NOT: -Drive a car -Paediatric nurse -Drink alcoholic beverages -Take any medication unless instructed by your physician -Make any legal decisions or sign important papers.  Meals: Start with liquid foods such as gelatin or soup. Progress to  regular foods as tolerated. Avoid greasy, spicy, heavy foods. If nausea and/or vomiting occur, drink only clear liquids until the nausea and/or vomiting subsides. Call your physician if vomiting continues.  Special Instructions/Symptoms: Your throat may feel dry or sore from the anesthesia or the breathing tube placed in your throat during surgery. If this causes discomfort, gargle with warm salt water. The discomfort should disappear within 24 hours.  If you had a scopolamine patch placed behind your ear for the management of post- operative nausea and/or vomiting:  1. The medication in the patch is effective for 72 hours, after which it should be removed.  Wrap patch in a tissue and discard in the trash. Wash hands thoroughly with soap and water. 2. You may remove the patch earlier than 72 hours if you experience unpleasant side effects which may include dry mouth, dizziness or visual disturbances. 3. Avoid touching the patch. Wash your hands with soap and water after contact with the patch.

## 2017-10-25 ENCOUNTER — Encounter (HOSPITAL_BASED_OUTPATIENT_CLINIC_OR_DEPARTMENT_OTHER): Payer: Self-pay | Admitting: General Surgery

## 2017-10-25 ENCOUNTER — Telehealth: Payer: Self-pay | Admitting: *Deleted

## 2017-10-25 NOTE — Anesthesia Postprocedure Evaluation (Signed)
Anesthesia Post Note  Patient: Rebekah Green  Procedure(s) Performed: BREAST LUMPECTOMY WITH RADIOACTIVE SEED LOCALIZATION (Left Breast)     Patient location during evaluation: PACU Anesthesia Type: General Level of consciousness: sedated and patient cooperative Pain management: pain level controlled Vital Signs Assessment: post-procedure vital signs reviewed and stable Respiratory status: spontaneous breathing Cardiovascular status: stable Anesthetic complications: no    Last Vitals:  Vitals:   10/23/17 1100 10/23/17 1115  BP: 113/71 124/74  Pulse: 70 69  Resp: 18 18  Temp:  36.6 C  SpO2: 100% 100%    Last Pain:  Vitals:   10/23/17 1115  TempSrc:   PainSc: 0-No pain                 Nolon Nations

## 2017-10-25 NOTE — Telephone Encounter (Signed)
  Oncology Nurse Navigator Documentation  Navigator Location: [facility] (10/25/17 1400)   )Navigator Encounter Type: Telephone;[facility] Follow-up (10/25/17 1400) Telephone: Outgoing Call;Clinic/[facility] Follow-up (10/25/17 1400)                                                  Time Spent with Patient: 15 (10/25/17 1400)

## 2017-10-28 NOTE — Progress Notes (Signed)
Please let patient know no invasive cancer and margins are negative.

## 2017-10-28 NOTE — Progress Notes (Signed)
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  Receptor Status: ER(NEG), PR (NEG)  10/23/17 Diagnosis Breast, lumpectomy, Left w/seed - BENIGN BREAST PARENCHYMA. - HEALING BIOPSY SITE. - THERE IS NO EVIDENCE OF MALIGNANCY.  Did patient present with symptoms or was this found on screening mammography?: She self palpated a cyst and called a physician to have it evaulated. She was eventually sent to have a mammogram and a difference was noted compared to her previous mammogram from 3 months prior and she was sent for a biopsy.    Past/Anticipated interventions by surgeon, if any: 10/23/17 Left Breast Radioactive seed localized lumpectomy Indications: This patient presents with history of left breast cancer, cTis, upper outer quadrant,  ER/PR -  Surgeon: [name]   Past/Anticipated interventions by medical oncology, if any: Dr. Jana Hakim 10/16/17 Breast Clinic.  (1) definitive surgery pending (done 10/23/17) (2) adjuvant radiation to follow (3) consider prophylactic antiestrogens (4) genetics testing scheduled for 10/18/2017  Lymphedema issues, if any: She denies. She has good arm mobility.    Pain issues, if any:  She denies.   SAFETY ISSUES:  Prior radiation? No  Pacemaker/ICD? No  Possible current pregnancy? No  Is the patient on methotrexate? No  Current Complaints / other details:    BP 119/71 (BP Location: Right Arm, Patient Position: Standing, Cuff Size: Normal)   Pulse 61   Temp 98.3 F (36.8 C) (Oral)   Resp 18   Ht 5\' 5"  (1.651 m)   Wt 139 lb 12.8 oz (63.4 kg)   LMP 08/20/2014   SpO2 100%   BMI 23.26 kg/m    Wt Readings from Last 3 Encounters:  11/08/17 139 lb 12.8 oz (63.4 kg)  10/23/17 138 lb (62.6 kg)  10/16/17 139 lb 11.2 oz (63.4 kg)      Johnathan Heskett, Stephani Police, RN 10/28/2017,4:17 PM

## 2017-11-04 ENCOUNTER — Telehealth: Payer: Self-pay | Admitting: Genetics

## 2017-11-04 NOTE — Telephone Encounter (Signed)
Revealed negative genetic testing.   This normal result is reassuring and indicates that it is unlikely Rebekah Green's cancer is due to a hereditary cause.  It is unlikely that there is an increased risk of another cancer due to a mutation in one of these genes.  However, genetic testing is not perfect, and cannot definitively rule out a hereditary cause.  It will be important for her to keep in contact with genetics to learn if any additional testing may be needed in the future.     Recommended her mother/maternal relatives also have genetic testing as there could be a genetic cause for the maternal family history of cancer that Ms. Pudwill did not inherit and would not be found in her testing.

## 2017-11-06 ENCOUNTER — Telehealth: Payer: Self-pay | Admitting: Oncology

## 2017-11-06 NOTE — Telephone Encounter (Signed)
Patient called to reschedule  °

## 2017-11-08 ENCOUNTER — Encounter: Payer: Self-pay | Admitting: Radiation Oncology

## 2017-11-08 ENCOUNTER — Other Ambulatory Visit: Payer: Self-pay

## 2017-11-08 ENCOUNTER — Ambulatory Visit
Admission: RE | Admit: 2017-11-08 | Discharge: 2017-11-08 | Disposition: A | Discharge status: Home / Self Care | Payer: 59 | Source: Ambulatory Visit | Attending: Radiation Oncology | Admitting: Radiation Oncology

## 2017-11-08 VITALS — BP 119/71 | HR 61 | Temp 98.3°F | Resp 18 | Ht 65.0 in | Wt 139.8 lb

## 2017-11-08 DIAGNOSIS — D0512 Intraductal carcinoma in situ of left breast: Secondary | ICD-10-CM

## 2017-11-08 DIAGNOSIS — Z79899 Other long term (current) drug therapy: Secondary | ICD-10-CM | POA: Diagnosis not present

## 2017-11-08 NOTE — Progress Notes (Signed)
Radiation Oncology         [phone] ________________________________  Name: RAFIA SHEDDEN MRN: [id number]  Date: 11/08/2017  DOB: [birth year]  Follow-Up Visit Note  Outpatient  CC: Celene Squibb, MD  Magrinat, Virgie Dad, MD  Diagnosis:      ICD-10-CM   1. Ductal carcinoma in situ (DCIS) of left breast D05.12      DCIS, ER- PR-, left breast. intermediate grade  CHIEF COMPLAINT: Here to discuss management of left breast cancer  Narrative:  The patient returns today for follow-up.  The patient was previously seen in our multidisciplinary breast clinic on 10/16/2017.   Since consultation date, she underwent the following imaging (dates and results as follows):  Bone density scan on 10/18/2017  Breast lumpectomy surgery on date of 10/23/2017 revealed:no residual disease  Symptomatically, the patient denies lymphedema issues. She has good arm mobility. She denies any pain issues. She is planning a vacation and will be returning on August 10th.        ALLERGIES:  has No Known Allergies.  Meds: Current Outpatient Medications  Medication Sig Dispense Refill  . Ascorbic Acid (VITAMIN C) 1000 MG tablet Take 1,000 mg by mouth daily.    . Cholecalciferol (VITAMIN D) 2000 UNITS tablet Take 2,000 Units by mouth every morning.      . Multiple Vitamins-Minerals (MULTIVITAMINS THER. W/MINERALS) TABS Take 1 tablet by mouth every morning.       No current facility-administered medications for this encounter.     Physical Findings:  height is 5' 5" (1.651 m) and weight is 139 lb 12.8 oz (63.4 kg). Her oral temperature is 98.3 F (36.8 C). Her blood pressure is 119/71 and her pulse is 61. Her respiration is 18 and oxygen saturation is 100%. .     General: Alert and oriented, in no acute distress. Accompanied by her husband today. HEENT: Head is normocephalic. Extraocular movements are intact. Oropharynx is clear. Neck: Neck is supple, no palpable cervical or supraclavicular  lymphadenopathy. Extremities: No cyanosis or edema. Lymphatics: see Neck Exam Musculoskeletal: symmetric strength and muscle tone throughout. Neurologic: No obvious focalities. Speech is fluent.  Psychiatric: Judgment and insight are intact. Affect is appropriate. Breast exam reveals breast healing well. Scar is intact in the upper outer quadrant of the left breast.  Lab Findings: Lab Results  Component Value Date   WBC 6.2 10/16/2017   HGB 14.7 10/16/2017   HCT 43.9 10/16/2017   MCV 90.7 10/16/2017   PLT 217 10/16/2017    Radiographic Findings: Mm Breast Surgical Specimen  Result Date: 10/23/2017 CLINICAL DATA:  Evaluate specimen EXAM: SPECIMEN RADIOGRAPH OF THE LEFT BREAST COMPARISON:  Previous exam(s). FINDINGS: Status post excision of the left breast. The radioactive seed and biopsy marker clip are present, completely intact, and were marked for pathology. IMPRESSION: Specimen radiograph of the left breast. Electronically Signed   By: Dorise Bullion III M.D   On: 10/23/2017 09:51   Dg Bone Density (dxa)  Result Date: 10/18/2017 EXAM: DUAL X-RAY ABSORPTIOMETRY (DXA) FOR BONE MINERAL DENSITY IMPRESSION: Referring Physician:  Nelia Shi KEY PATIENT: Name: Marshae, Azam Patient ID: [id number] Birth Date: [birth year] Height: 65.0 in. Sex: Female Measured: 10/18/2017 Weight: 139.0 lbs. Indications: Breast Cancer History, Caucasian, Estrogen Deficient, Postmenopausal Fractures: None Treatments: Multivitamin, Vitamin D (E933.5) ASSESSMENT: The BMD measured at AP Spine L1-L4 is 0.804 g/cm2 with a T-score of -3.1. This patient is considered osteoporotic according to [facility]) criteria. This scan is good quality.  Site Region Measured Date Measured Age YA BMD Significant CHANGE T-score AP Spine  L1-L4      10[age]18/2017    59.0         -3.1    0.804 g/cm2 DualFemur Neck Right 10/18/2017    59.0         -1.9    0.769 g/cm2 World Health Organization [facility]) criteria for post-menopausal,  Caucasian Women: Normal       T-score at or above -1 SD Osteopenia   T-score between -1 and -2.5 SD Osteoporosis T-score at or below -2.5 SD RECOMMENDATION: 1. All patients should optimize calcium and vitamin D intake. 2. Consider FDA approved medical therapies in postmenopausal women and men aged 97 years and older, based on the following: a. A hip or vertebral (clinical or morphometric) fracture b. T- score < or = -2.5 at the femoral neck or spine after appropriate evaluation to exclude secondary causes c. Low bone mass (T-score between -1.0 and -2.5 at the femoral neck or spine) and a 10 year probability of a hip fracture > or = 3% or a 10 year probability of a major osteoporosis-related fracture > or = 20% based on the [place]-adapted WHO algorithm d. Clinician judgment and/or patient preferences may indicate treatment for people with 10-year fracture probabilities above or below these levels FOLLOW-UP: People with diagnosed cases of osteoporosis or at high risk for fracture should have regular bone mineral density tests. For patients eligible for Medicare, routine testing is allowed once every 2 years. The testing frequency can be increased to one year for patients who have rapidly progressing disease, those who are receiving or discontinuing medical therapy to restore bone mass, or have additional risk factors. I have reviewed this report and agree with the above findings. [facility] Electronically Signed   By: Marijo Conception, M.D.   On: 10/18/2017 10:37   Mm Diag Breast Tomo Uni Right  Result Date: 10/14/2017 CLINICAL DATA:  57 year old patient recently diagnosed with ductal situ of the left breast. She presents for mammography of the right breast prior to initiating therapy for her left breast ductal carcinoma in situ. EXAM: DIGITAL DIAGNOSTIC UNILATERAL RIGHT MAMMOGRAM WITH CAD AND TOMO COMPARISON:  Previous exam(s). ACR Breast Density Category c: The breast tissue is heterogeneously dense, which  may obscure small masses. FINDINGS: No mass, architectural distortion, or suspicious microcalcification identified in the right breast to suggest malignancy. Mammographic images were processed with CAD. IMPRESSION: No evidence of malignancy in the right breast. RECOMMENDATION: Diagnostic mammogram is suggested in 1 year. (Code:[id number]) I have discussed the findings and recommendations with the patient. Results were also provided in writing at the conclusion of the visit. If applicable, a reminder letter will be sent to the patient regarding the next appointment. BI-RADS CATEGORY  1: Negative. Electronically Signed   By: Curlene Dolphin M.D.   On: 10/14/2017 11:30   Mm Lt Radioactive Seed Loc Mammo Guide  Result Date: 10/22/2017 CLINICAL DATA:  Localization prior to surgery EXAM: MAMMOGRAPHIC GUIDED RADIOACTIVE SEED LOCALIZATION OF THE LEFT BREAST COMPARISON:  Previous exam(s). FINDINGS: Patient presents for radioactive seed localization prior to surgery. I met with the patient and we discussed the procedure of seed localization including benefits and alternatives. We discussed the high likelihood of a successful procedure. We discussed the risks of the procedure including infection, bleeding, tissue injury and further surgery. We discussed the low dose of radioactivity involved in the procedure. Informed, written consent was given. The usual time-out protocol  was performed immediately prior to the procedure. Using mammographic guidance, sterile technique, 1% lidocaine and an I-125 radioactive seed, the residual calcifications was localized using a superior approach. The follow-up mammogram images confirm the seed in the expected location and were marked for the surgeon. Follow-up survey of the patient confirms presence of the radioactive seed. Order number of I-125 seed:  [id number]. Total activity:  2.025 millicuries reference Date: October 04, 2017 The patient tolerated the procedure well and was released from the  [facility]. She was given instructions regarding seed removal. IMPRESSION: Radioactive seed localization left breast. Of note, the residual calcification at the site of DCIS was localized. The biopsy clip is approximately 9 mm away from this residual calcification and the radioactive seed. Electronically Signed   By: Dorise Bullion III M.D   On: 10/22/2017 15:24    Impression/Plan: Left breast DCIS, ER negative We discussed adjuvant radiotherapy today.  I recommend radiotherapy to left breast in order to reduce risk of local recurrence by 1/2.  The risks, benefits and side effects of this treatment were discussed in detail.  She understands that radiotherapy is associated with skin irritation and fatigue in the acute setting. Late effects can include cosmetic changes and rare injury to internal organs. She is enthusiastic about proceeding with treatment. A consent form has been signed and placed in her chart.  A total of 3 medically necessary complex treatment devices will be fabricated and supervised by me: 2 fields with MLCs for custom blocks to protect heart, and lungs;  and, a Vac-lok. MORE COMPLEX DEVICES MAY BE MADE IN DOSIMETRY FOR FIELD IN FIELD BEAMS FOR DOSE HOMOGENEITY.  I have requested : 3D Simulation which is medically necessary to give adequate dose to at risk tissues while sparing lungs and heart.  I have requested a DVH of the following structures: lungs, heart, left lumpectomy cavity.    The patient will receive 40.05 Gy in 15 fractions to the left breast with 2 fields.  This will be followed by a boost.  I spent at least 15 minutes minutes face to face with the patient and more than 50% of that time was spent in counseling and/or coordination of care. _____________________________________   Eppie Gibson, MD   This document serves as a record of services personally performed by Eppie Gibson, MD. It was created on her behalf by Arlyce Harman, a trained medical scribe. The  creation of this record is based on the scribe's personal observations and the provider's statements to them. This document has been checked and approved by the attending provider.

## 2017-11-11 ENCOUNTER — Encounter: Payer: Self-pay | Admitting: Radiation Oncology

## 2017-11-20 ENCOUNTER — Encounter: Payer: Self-pay | Admitting: Genetics

## 2017-11-20 ENCOUNTER — Ambulatory Visit: Payer: Self-pay | Admitting: Genetics

## 2017-11-20 DIAGNOSIS — Z1379 Encounter for other screening for genetic and chromosomal anomalies: Secondary | ICD-10-CM

## 2017-11-20 DIAGNOSIS — Z803 Family history of malignant neoplasm of breast: Secondary | ICD-10-CM

## 2017-11-20 DIAGNOSIS — D0512 Intraductal carcinoma in situ of left breast: Secondary | ICD-10-CM

## 2017-11-20 NOTE — Progress Notes (Signed)
HPI:  Rebekah Green was previously seen in the [facility] on 10/18/2017 due to a personal and family history of breast cancer and concerns regarding a hereditary predisposition to cancer. Please refer to our prior cancer genetics clinic note for more information regarding Rebekah Green's medical, social and family histories, and our assessment and recommendations, at the time. Rebekah Green recent genetic test results were disclosed to her, as well as recommendations warranted by these results. These results and recommendations are discussed in more detail below.  CANCER HISTORY:   No history exists.     FAMILY HISTORY:  We obtained a detailed, 4-generation family history.  Significant diagnoses are listed below: Family History  Problem Relation Age of Onset  . Breast cancer Mother 19  . Non-Hodgkin's lymphoma Brother 73       B cell  . Breast cancer Cousin 53  . Breast cancer Other        dx late 20's/ 30's.   . Anesthesia problems Neg Hx   . Hypotension Neg Hx   . Malignant hyperthermia Neg Hx   . Pseudochol deficiency Neg Hx     Rebekah Green has a 66 year-old son who has had skin cancer (removed, no further treatment) unk if melanoma. Rebekah Green also has a brother who had non-hodgkins lymphoma at 74.  He has a son and a daughter.   Rebekah Green father: 40 no history of cancer.  Paternal aunts/Uncles: 4 paternal uncles no history of cancer, 3 are deceased (died in 97s 7)   Paternal cousins: no history of cancer.  Paternal grandfather: died in old age, no history of cancer.  Paternal grandmother:died in old age, no history of cancer.   Rebekah Green mother: breast cancer dx at 58, is currently 27.  Maternal Aunts/Uncles: 3 maternal aunts with no history of cancer. 1 is 87, the others died in their 52's.  Maternal cousins: 1 maternal cousin died of breast cancer that was dx about 59 years of age Maternal grandfather: died in his 80's with no history of cancer.  Maternal  grandmother:died in her 37's with no history of cancer. This grandmother had a sister (patients [place] aunt) who died of breast cancer in her late 73's/30's.   Rebekah Green is unaware of previous family history of genetic testing for hereditary cancer risks. Patient's maternal ancestors are of N. Euroepan descent, and paternal ancestors are of N. European descent. There is no reported Ashkenazi Jewish ancestry. There is no known consanguinity.   GENETIC TEST RESULTS: Genetic testing performed through [facility]'s Common Hereditary Cancers Panel reported out on 10/30/2017 showed no pathogenic mutations. The Common Hereditary Cancer Panel offered by [facility] includes sequencing and/or deletion duplication testing of the following 47 genes: APC, ATM, AXIN2, BARD1, BMPR1A, BRCA1, BRCA2, BRIP1, CDH1, CDKN2A (p14ARF), CDKN2A (p16INK4a), CKD4, CHEK2, CTNNA1, DICER1, EPCAM (Deletion/duplication testing only), GREM1 (promoter region deletion/duplication testing only), KIT, MEN1, MLH1, MSH2, MSH3, MSH6, MUTYH, NBN, NF1, NHTL1, PALB2, PDGFRA, PMS2, POLD1, POLE, PTEN, RAD50, RAD51C, RAD51D, SDHB, SDHC, SDHD, SMAD4, SMARCA4. STK11, TP53, TSC1, TSC2, and VHL.  The following genes were evaluated for sequence changes only: SDHA and HOXB13 c.251G>A variant only..   The test report will be scanned into EPIC and will be located under the Molecular Pathology section of the Results Review tab. A portion of the result report is included below for reference.     We discussed with Rebekah Green that because current genetic testing is not perfect, it is possible there  may be a gene mutation in one of these genes that current testing cannot detect, but that chance is small.  We also discussed, that there could be another gene that has not yet been discovered, or that we have not yet tested, that is responsible for the cancer diagnoses in the family. It is also possible there is a hereditary cause for the cancer in the family that Rebekah Green  did not inherit and therefore was not identified in her testing.  Therefore, it is important to remain in touch with cancer genetics in the future so that we can continue to offer Rebekah Green the most up to date genetic testing.   ADDITIONAL GENETIC TESTING: We discussed with Rebekah Green that there are other genes that are associated with increased cancer risk that can be analyzed. The laboratories that offer this testing look at these additional genes via a hereditary cancer gene panel. Should Rebekah Green wish to pursue additional genetic testing, we are happy to discuss and coordinate this testing, at any time.    CANCER SCREENING RECOMMENDATIONS: This normal indicates that it is unlikely Rebekah Green has an increased risk of cancer due to a mutation in one of these genes..   While reassuring, this does not definitively rule out a hereditary predisposition to cancer. It is still possible that there could be genetic mutations that are undetectable by current technology, or genetic mutations in genes that have not been tested or identified to increase cancer risk.  Therefore, it is recommended she continue to follow the cancer management and screening guidelines provided by her oncology and primary healthcare provider. An individual's cancer risk is not determined by genetic test results alone.  Overall cancer risk assessment includes additional factors such as personal medical history, family history, etc.  These should be used to make a personalized plan for cancer prevention and surveillance.    RECOMMENDATIONS FOR FAMILY MEMBERS:  Relatives in this family might be at some increased risk of developing cancer, over the general population risk, simply due to the family history of cancer.  We recommended women in this family have a yearly mammogram beginning at age 9, or 29 years younger than the earliest onset of cancer, an annual clinical breast exam, and perform monthly breast self-exams. Women in this family  should also have a gynecological exam as recommended by their primary provider. All family members should have a colonoscopy by age 91 (or as directed by their doctors).  All family members should inform their physicians about the family history of cancer so their doctors can make the most appropriate screening recommendations for them.   It is also possible there is a hereditary cause for the cancer in Rebekah Green. Bolanos's family that she did not inherit and therefore was not identified in her.  Therefore, we recommended maternal relatives (especially those affected with cancer) also, have genetic counseling and testing. Rebekah Green. Biffle will let us know if we can be of any assistance in coordinating genetic counseling and/or testing for these family members.   FOLLOW-UP: Lastly, we discussed with Rebekah Green. Witham that cancer genetics is a rapidly advancing field and it is possible that new genetic tests will be appropriate for her and/or her family members in the future. We encouraged her to remain in contact with cancer genetics on an annual basis so we can update her personal and family histories and let her know of advances in cancer genetics that may benefit this family.   Our contact number was provided.  Rebekah Green. Largent questions were answered to her satisfaction, and she knows she is welcome to call us at anytime with additional questions or concerns.   Ferol Luz, Rebekah Green, [facility] Certified Genetic Counselor [email or web address]

## 2017-12-06 ENCOUNTER — Ambulatory Visit
Admission: RE | Admit: 2017-12-06 | Discharge: 2017-12-06 | Disposition: A | Discharge status: Home / Self Care | Payer: 59 | Source: Ambulatory Visit | Attending: Radiation Oncology | Admitting: Radiation Oncology

## 2017-12-06 ENCOUNTER — Encounter: Payer: Self-pay | Admitting: Radiation Oncology

## 2017-12-06 DIAGNOSIS — D0512 Intraductal carcinoma in situ of left breast: Secondary | ICD-10-CM | POA: Diagnosis not present

## 2017-12-06 DIAGNOSIS — Z510 Encounter for antineoplastic radiation therapy: Secondary | ICD-10-CM | POA: Insufficient documentation

## 2017-12-06 NOTE — Progress Notes (Signed)
Radiation Oncology         [phone] ________________________________  Name: Rebekah Green MRN: [id number]  Date: 12/06/2017  DOB: 10-14-1960  SIMULATION AND TREATMENT PLANNING NOTE // Special treatment procedure    Outpatient  DIAGNOSIS:     ICD-10-CM   1. Ductal carcinoma in situ (DCIS) of left breast D05.12     NARRATIVE:  The patient was brought to the [facility].  Identity was confirmed.  All relevant records and images related to the planned course of therapy were reviewed.  The patient freely provided informed written consent to proceed with treatment after reviewing the details related to the planned course of therapy. The consent form was witnessed and verified by the simulation staff.    Then, the patient was set-up in a stable reproducible supine position for radiation therapy with her ipsilateral arm over her head, and her upper body secured in a custom-made Vac-lok device.  CT images were obtained.  Surface markings were placed.  The CT images were loaded into the planning software.    Special treatment procedure:  Special treatment procedure was performed today due to the extra time and effort required by myself to plan and prepare this patient for deep inspiration breath hold technique.  I have determined cardiac sparing to be of benefit to this patient to prevent long term cardiac damage due to radiation of the heart.  Bellows were placed on the patient's abdomen. To facilitate cardiac sparing, the patient was coached by the radiation therapists on breath hold techniques and breathing practice was performed. Practice waveforms were obtained. The patient was then scanned while maintaining breath hold in the treatment position.  This image was then transferred over to the imaging specialist. The imaging specialist then created a fusion of the free breathing and breath hold scans using the chest wall as the stable structure. I personally reviewed the fusion in  axial, coronal and sagittal image planes.  Excellent cardiac sparing was obtained.  I felt the patient is an appropriate candidate for breath hold and the patient will be treated as such.  The image fusion was then reviewed with the patient to reinforce the necessity of reproducible breath hold.  TREATMENT PLANNING NOTE: Treatment planning then occurred.  The radiation prescription was entered and confirmed.     A total of 3 medically necessary complex treatment devices were fabricated and supervised by me: 2 fields with MLCs for custom blocks to protect heart, and lungs;  and, a Vac-lok. MORE COMPLEX DEVICES MAY BE MADE IN DOSIMETRY FOR FIELD IN FIELD BEAMS FOR DOSE HOMOGENEITY.  I have requested : 3D Simulation which is medically necessary to give adequate dose to at risk tissues while sparing lungs and heart.  I have requested a DVH of the following structures: lungs, heart, left lumpectomy cavity.    The patient will receive 40.05 Gy in 15 fractions to the left breast with 2 tangential fields.  This will be followed by a boost.  Optical Surface Tracking Plan:  Since intensity modulated radiotherapy (IMRT) and 3D conformal radiation treatment methods are predicated on accurate and precise positioning for treatment, intrafraction motion monitoring is medically necessary to ensure accurate and safe treatment delivery. The ability to quantify intrafraction motion without excessive ionizing radiation dose can only be performed with optical surface tracking. Accordingly, surface imaging offers the opportunity to obtain 3D measurements of patient position throughout IMRT and 3D treatments without excessive radiation exposure. I am ordering optical surface tracking for  this patient's upcoming course of radiotherapy.  ________________________________   Reference:  Particia Jasper, et al. Surface imaging-based analysis of intrafraction motion for breast radiotherapy patients.Journal of  [place], n. 6, nov. 2014. ISSN [id number].  Available at: <http://www.jacmp.org/index.php/jacmp/article/view/4957>.    -----------------------------------  Eppie Gibson, MD

## 2017-12-09 DIAGNOSIS — Z510 Encounter for antineoplastic radiation therapy: Secondary | ICD-10-CM | POA: Diagnosis not present

## 2017-12-11 ENCOUNTER — Ambulatory Visit
Admission: RE | Admit: 2017-12-11 | Discharge: 2017-12-11 | Disposition: A | Discharge status: Home / Self Care | Payer: 59 | Source: Ambulatory Visit | Attending: Radiation Oncology | Admitting: Radiation Oncology

## 2017-12-11 DIAGNOSIS — Z510 Encounter for antineoplastic radiation therapy: Secondary | ICD-10-CM | POA: Diagnosis not present

## 2017-12-11 DIAGNOSIS — D0512 Intraductal carcinoma in situ of left breast: Secondary | ICD-10-CM

## 2017-12-11 MED ORDER — ALRA NON-METALLIC DEODORANT (RAD-ONC)
1.0000 "application " | Freq: Once | TOPICAL | Status: AC
  Administered 2017-12-11: 1 via TOPICAL

## 2017-12-11 MED ORDER — RADIAPLEXRX EX GEL
Freq: Once | CUTANEOUS | Status: AC
  Administered 2017-12-11: 09:00:00 via TOPICAL

## 2017-12-12 ENCOUNTER — Ambulatory Visit
Admission: RE | Admit: 2017-12-12 | Discharge: 2017-12-12 | Disposition: A | Discharge status: Home / Self Care | Payer: 59 | Source: Ambulatory Visit | Attending: Radiation Oncology | Admitting: Radiation Oncology

## 2017-12-12 DIAGNOSIS — Z510 Encounter for antineoplastic radiation therapy: Secondary | ICD-10-CM | POA: Diagnosis not present

## 2017-12-12 NOTE — Progress Notes (Signed)
Pt here for patient teaching.  Pt given Radiation and You booklet, skin care instructions, Alra deodorant and Radiaplex gel.  Reviewed areas of pertinence such as fatigue, skin changes, breast tenderness and breast swelling . Pt able to give teach back of to pat skin, use unscented/gentle soap, have Imodium on hand and drink plenty of water,apply Radiaplex bid, avoid applying anything to skin within 4 hours of treatment, avoid wearing an under wire bra and to use an electric razor if they must shave. Pt verbalizes understanding of information given and will contact nursing with any questions or concerns.     [email or web address]

## 2017-12-13 ENCOUNTER — Ambulatory Visit
Admission: RE | Admit: 2017-12-13 | Discharge: 2017-12-13 | Disposition: A | Discharge status: Home / Self Care | Payer: 59 | Source: Ambulatory Visit | Attending: Radiation Oncology | Admitting: Radiation Oncology

## 2017-12-13 DIAGNOSIS — Z510 Encounter for antineoplastic radiation therapy: Secondary | ICD-10-CM | POA: Diagnosis not present

## 2017-12-16 ENCOUNTER — Ambulatory Visit
Admission: RE | Admit: 2017-12-16 | Discharge: 2017-12-16 | Disposition: A | Discharge status: Home / Self Care | Payer: 59 | Source: Ambulatory Visit | Attending: Radiation Oncology | Admitting: Radiation Oncology

## 2017-12-16 DIAGNOSIS — Z510 Encounter for antineoplastic radiation therapy: Secondary | ICD-10-CM | POA: Diagnosis not present

## 2017-12-17 ENCOUNTER — Ambulatory Visit
Admission: RE | Admit: 2017-12-17 | Discharge: 2017-12-17 | Disposition: A | Discharge status: Home / Self Care | Payer: 59 | Source: Ambulatory Visit | Attending: Radiation Oncology | Admitting: Radiation Oncology

## 2017-12-17 DIAGNOSIS — Z510 Encounter for antineoplastic radiation therapy: Secondary | ICD-10-CM | POA: Diagnosis not present

## 2017-12-18 ENCOUNTER — Ambulatory Visit
Admission: RE | Admit: 2017-12-18 | Discharge: 2017-12-18 | Disposition: A | Discharge status: Home / Self Care | Payer: 59 | Source: Ambulatory Visit | Attending: Radiation Oncology | Admitting: Radiation Oncology

## 2017-12-18 DIAGNOSIS — Z510 Encounter for antineoplastic radiation therapy: Secondary | ICD-10-CM | POA: Diagnosis not present

## 2017-12-19 ENCOUNTER — Ambulatory Visit
Admission: RE | Admit: 2017-12-19 | Discharge: 2017-12-19 | Disposition: A | Discharge status: Home / Self Care | Payer: 59 | Source: Ambulatory Visit | Attending: Radiation Oncology | Admitting: Radiation Oncology

## 2017-12-19 DIAGNOSIS — Z510 Encounter for antineoplastic radiation therapy: Secondary | ICD-10-CM | POA: Diagnosis not present

## 2017-12-20 ENCOUNTER — Ambulatory Visit
Admission: RE | Admit: 2017-12-20 | Discharge: 2017-12-20 | Disposition: A | Discharge status: Home / Self Care | Payer: 59 | Source: Ambulatory Visit | Attending: Radiation Oncology | Admitting: Radiation Oncology

## 2017-12-20 DIAGNOSIS — Z510 Encounter for antineoplastic radiation therapy: Secondary | ICD-10-CM | POA: Diagnosis not present

## 2017-12-24 ENCOUNTER — Ambulatory Visit
Admission: RE | Admit: 2017-12-24 | Discharge: 2017-12-24 | Disposition: A | Discharge status: Home / Self Care | Payer: 59 | Source: Ambulatory Visit | Attending: Radiation Oncology | Admitting: Radiation Oncology

## 2017-12-24 DIAGNOSIS — Z510 Encounter for antineoplastic radiation therapy: Secondary | ICD-10-CM | POA: Diagnosis present

## 2017-12-24 DIAGNOSIS — D0512 Intraductal carcinoma in situ of left breast: Secondary | ICD-10-CM | POA: Insufficient documentation

## 2017-12-25 ENCOUNTER — Ambulatory Visit
Admission: RE | Admit: 2017-12-25 | Discharge: 2017-12-25 | Disposition: A | Discharge status: Home / Self Care | Payer: 59 | Source: Ambulatory Visit | Attending: Radiation Oncology | Admitting: Radiation Oncology

## 2017-12-25 DIAGNOSIS — Z510 Encounter for antineoplastic radiation therapy: Secondary | ICD-10-CM | POA: Diagnosis not present

## 2017-12-26 ENCOUNTER — Ambulatory Visit
Admission: RE | Admit: 2017-12-26 | Discharge: 2017-12-26 | Disposition: A | Discharge status: Home / Self Care | Payer: 59 | Source: Ambulatory Visit | Attending: Radiation Oncology | Admitting: Radiation Oncology

## 2017-12-26 DIAGNOSIS — Z510 Encounter for antineoplastic radiation therapy: Secondary | ICD-10-CM | POA: Diagnosis not present

## 2017-12-27 ENCOUNTER — Ambulatory Visit
Admission: RE | Admit: 2017-12-27 | Discharge: 2017-12-27 | Disposition: A | Discharge status: Home / Self Care | Payer: 59 | Source: Ambulatory Visit | Attending: Radiation Oncology | Admitting: Radiation Oncology

## 2017-12-27 DIAGNOSIS — Z510 Encounter for antineoplastic radiation therapy: Secondary | ICD-10-CM | POA: Diagnosis not present

## 2017-12-30 ENCOUNTER — Ambulatory Visit
Admission: RE | Admit: 2017-12-30 | Discharge: 2017-12-30 | Disposition: A | Discharge status: Home / Self Care | Payer: 59 | Source: Ambulatory Visit | Attending: Radiation Oncology | Admitting: Radiation Oncology

## 2017-12-30 DIAGNOSIS — D0512 Intraductal carcinoma in situ of left breast: Secondary | ICD-10-CM

## 2017-12-30 DIAGNOSIS — Z510 Encounter for antineoplastic radiation therapy: Secondary | ICD-10-CM | POA: Diagnosis not present

## 2017-12-31 ENCOUNTER — Ambulatory Visit
Admission: RE | Admit: 2017-12-31 | Discharge: 2017-12-31 | Disposition: A | Discharge status: Home / Self Care | Payer: 59 | Source: Ambulatory Visit | Attending: Radiation Oncology | Admitting: Radiation Oncology

## 2017-12-31 DIAGNOSIS — Z510 Encounter for antineoplastic radiation therapy: Secondary | ICD-10-CM | POA: Diagnosis not present

## 2018-01-01 ENCOUNTER — Ambulatory Visit
Admission: RE | Admit: 2018-01-01 | Discharge: 2018-01-01 | Disposition: A | Discharge status: Home / Self Care | Payer: 59 | Source: Ambulatory Visit | Attending: Radiation Oncology | Admitting: Radiation Oncology

## 2018-01-01 DIAGNOSIS — Z510 Encounter for antineoplastic radiation therapy: Secondary | ICD-10-CM | POA: Diagnosis not present

## 2018-01-02 ENCOUNTER — Ambulatory Visit
Admission: RE | Admit: 2018-01-02 | Discharge: 2018-01-02 | Disposition: A | Discharge status: Home / Self Care | Payer: 59 | Source: Ambulatory Visit | Attending: Radiation Oncology | Admitting: Radiation Oncology

## 2018-01-02 DIAGNOSIS — Z510 Encounter for antineoplastic radiation therapy: Secondary | ICD-10-CM | POA: Diagnosis not present

## 2018-01-03 ENCOUNTER — Ambulatory Visit
Admission: RE | Admit: 2018-01-03 | Discharge: 2018-01-03 | Disposition: A | Discharge status: Home / Self Care | Payer: 59 | Source: Ambulatory Visit | Attending: Radiation Oncology | Admitting: Radiation Oncology

## 2018-01-03 DIAGNOSIS — Z510 Encounter for antineoplastic radiation therapy: Secondary | ICD-10-CM | POA: Diagnosis not present

## 2018-01-06 ENCOUNTER — Ambulatory Visit
Admission: RE | Admit: 2018-01-06 | Discharge: 2018-01-06 | Disposition: A | Discharge status: Home / Self Care | Payer: 59 | Source: Ambulatory Visit | Attending: Radiation Oncology | Admitting: Radiation Oncology

## 2018-01-06 DIAGNOSIS — Z510 Encounter for antineoplastic radiation therapy: Secondary | ICD-10-CM | POA: Diagnosis not present

## 2018-01-07 ENCOUNTER — Ambulatory Visit
Admission: RE | Admit: 2018-01-07 | Discharge: 2018-01-07 | Disposition: A | Discharge status: Home / Self Care | Payer: 59 | Source: Ambulatory Visit | Attending: Radiation Oncology | Admitting: Radiation Oncology

## 2018-01-07 DIAGNOSIS — Z510 Encounter for antineoplastic radiation therapy: Secondary | ICD-10-CM | POA: Diagnosis not present

## 2018-01-08 ENCOUNTER — Ambulatory Visit
Admission: RE | Admit: 2018-01-08 | Discharge: 2018-01-08 | Disposition: A | Discharge status: Home / Self Care | Payer: 59 | Source: Ambulatory Visit | Attending: Radiation Oncology | Admitting: Radiation Oncology

## 2018-01-08 DIAGNOSIS — Z510 Encounter for antineoplastic radiation therapy: Secondary | ICD-10-CM | POA: Diagnosis not present

## 2018-01-09 ENCOUNTER — Ambulatory Visit: Payer: 59

## 2018-01-10 ENCOUNTER — Encounter: Payer: Self-pay | Admitting: Radiation Oncology

## 2018-01-10 NOTE — Progress Notes (Signed)
  Radiation Oncology         [phone] ________________________________  Name: Rebekah Green MRN: [id number]  Date: 01/10/2018  DOB: 1961-03-23  End of Treatment Note  Diagnosis:  DCIS, ER-, PR-, left breast, intermediate grade Cancer Staging Ductal carcinoma in situ (DCIS) of left breast Staging form: Breast, AJCC 8th Edition - Clinical stage from 10/16/2017: Stage 0 (cTis (DCIS), cN0, cM0, ER-, PR-) - Unsigned     Indication for treatment:  Curative       Radiation treatment dates:   12/11/2017-01/08/2018  Site/dose:   1. Left breast, 2.67 Gy x 15 fractions for a total dose of 40.05 Gy           2. Left breast boost, 2 Gy x  5 fractions for a total dose of 10 Gy  Beams/energy:   1. 3D, 6X         2. Electron, 9E  Narrative: The patient tolerated radiation treatment relatively well. At the beginning of her treatment, she denied pain or fatigue. She was given radiaplex and instructed on its use. Towards the end of her treatment, she noted mild fatigue and redness to the radiation sites. She denied pain. She was continuing with the use of Radiaplex. 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   Plan: The patient has completed radiation treatment. The patient will return to radiation oncology clinic for routine followup in one month. I advised them to call or return sooner if they have any questions or concerns related to their recovery or treatment.  -----------------------------------  Eppie Gibson, MD  This document serves as a record of services personally performed by Eppie Gibson, MD. It was created on her behalf by Steva Colder, a trained medical scribe. The creation of this record is based on the scribe's personal observations and the provider's statements to them. This document has been checked and approved by the attending provider.

## 2018-01-22 ENCOUNTER — Ambulatory Visit: Payer: 59 | Admitting: Oncology

## 2018-01-23 ENCOUNTER — Ambulatory Visit: Payer: 59 | Admitting: Oncology

## 2018-02-04 NOTE — Progress Notes (Signed)
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 However, she subsequently developed a palpable abnormality in the left breast. She underwent unilateral left diagnostic mammography with tomography and left breast ultrasonography at [facility] on 10/09/2017 showing: breast density category C. At the 3 o'clock position upper outer quadrant of the left breast, there is an oval partially obscured mass with grouped calcification measuring 0.4 x 0.3 cm. By ultrasound, there was a 2.1 cm mass mass consistent with benign cyst and was hypoechoic with anechoic portions. The right axilla was negative for lymphadenopathy. Unilateral right diagnostic mammography showed no evidence of malignancy in the right breast.   Accordingly on 10/09/2017 she proceeded to biopsy of the left breast area in question. The pathology from this procedure showed ([id number]): Ductal carcinoma in situ, intermediate grade, with calcifications. Prognostic indicators significant for: both estrogen and progesterone receptor, 0% negative.  The cyst was drained with no residual postprocedure.   The patient's subsequent history is as detailed below.  INTERVAL HISTORY: [name] returns today for  follow up and treatment of her estrogen receptor negative non-invasive breast cancer accompanied by her husband and son. She underwent left lumpectomy on 10/23/2017 showing [phone]): benign breast parenchyma with no evidence of malignancy. She denies issues with bleeding, fever, or significant pain.   She also completed adjuvant radiation treatments on 01/08/2018. She did not have skin pealing or soreness.  She had some fatigue, but she attributes this to having early morning appointments and working in the evening. Her energy has fully returned.    REVIEW OF SYSTEMS: Rebekah Green reports that she is doing well. She has good ROM of her left arm, and she can raise it above her head with no pain. She denies unusual headaches, visual changes, nausea, vomiting, or dizziness. There has been no unusual cough, phlegm production, or pleurisy. There has been no change in bowel or bladder habits. She denies unexplained fatigue or unexplained weight loss, bleeding, rash, or fever. A detailed review of systems was otherwise stable.     PAST MEDICAL HISTORY: Past Medical History:  Diagnosis Date  . Cancer ([place]) 09/2017   DCIS left breast  . Family history of breast cancer       PAST SURGICAL HISTORY: Past Surgical History:  Procedure Laterality Date  . BREAST CYST ASPIRATION    . BREAST EXCISIONAL BIOPSY     unsure of breast, no scar  . BREAST LUMPECTOMY WITH RADIOACTIVE SEED LOCALIZATION Left 10/23/2017   Procedure: BREAST LUMPECTOMY WITH RADIOACTIVE SEED LOCALIZATION;  Surgeon: [name], MD;  Location: [facility];  Service: General;  Laterality: Left;  Rebekah Kitchen MASS EXCISION  03/30/2011   Procedure: EXCISION MASS;  Surgeon: Donato Heinz;  Location: [facility];  Service: General;  Laterality: N/A;  Excision Soft Tissue Mass Abdominal Wall    FAMILY HISTORY Family History  Problem Relation Age of Onset  . Breast cancer Mother 37  . Non-Hodgkin's lymphoma Brother 41       B cell  . Breast  cancer Cousin 19  . Breast cancer Other        dx late 20's/ 30's.   . Anesthesia problems Neg Hx   . Hypotension Neg Hx   . Malignant hyperthermia Neg Hx   . Pseudochol deficiency Neg Hx   As of June 2019, the patient's father is alive at age 59. The patient's mother is alive at age 59, with a history of breast cancer diagnosed at age 33. The patient has 1 brother and 2 sisters. The patient's brother was diagnosed with non- Hodgkin's lymphoma remotely. There was a 1st cousin with breast cancer diagnosed in the late 50's. The patient's grandmother's sister also had breast cancer. The patient denies a family history of ovarian cancer.    GYNECOLOGIC HISTORY:  Patient's last menstrual period was 08/20/2014. 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  Age at first live birth: 57 years old She is GXP1. Her LMP was April 2017. She used oral contraceptive for 5 years with no complications. She never used HRT.   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 The patient is married to Rebekah Green, who is an EHS (environmental health and safety) specialist. Their son, Rebekah Kitchen "[name]" is 108 lives in [place] and works as a Government social research officer for [employer], IT trainer stations.      ADVANCED DIRECTIVES:    HEALTH MAINTENANCE: Social History   Tobacco Use  . Smoking status: Never Smoker  . Smokeless tobacco: Never Used  Substance Use Topics  . Alcohol use: Yes    Comment: rare  . Drug use: No     Colonoscopy: 2010  PAP: 08/2017  Bone density: 2008 - scheduled 10/18/2017   No Known Allergies  Current Outpatient Medications  Medication Sig Dispense Refill  . Ascorbic Acid (VITAMIN C) 1000 MG tablet Take 1,000 mg by mouth daily.    . Cholecalciferol (VITAMIN D) 2000 UNITS tablet Take 2,000 Units by mouth every morning.      . Multiple Vitamins-Minerals (MULTIVITAMINS THER. W/MINERALS) TABS Take 1 tablet by mouth every  morning.       No current facility-administered medications for this visit.     OBJECTIVE: Middle-aged white woman who appears well Vitals:   02/05/18 1144  BP: 130/77  Pulse: (!) 56  Resp: 18  Temp: 98.3 F (36.8 C)  SpO2: 100%     Body mass index is 22.91 kg/m.   Wt Readings from Last 3 Encounters:  02/05/18 137 lb 11.2 oz (62.5 kg)  11/08/17 139 lb 12.8 oz (63.4 kg)  10/23/17 138 lb (62.6 kg)      ECOG FS:0 - Asymptomatic  Sclerae unicteric, EOMs intact No cervical or supraclavicular adenopathy Lungs no rales or rhonchi Heart regular rate and rhythm Abd soft, nontender, positive bowel sounds MSK no focal spinal tenderness, no upper extremity lymphedema Neuro: nonfocal, well oriented, appropriate affect Breasts: The right breast is unremarkable.  The left breast is status post lumpectomy.  The incision is healing nicely, without erythema, swelling, or dehiscence.  Both axillae are benign.  LAB RESULTS:  CMP     Component Value Date/Time   NA 141 10/16/2017 1220   K 3.9 10/16/2017 1220   CL 103 10/16/2017 1220   CO2 30 10/16/2017 1220   GLUCOSE  103 (H) 10/16/2017 1220   BUN 14 10/16/2017 1220   CREATININE 0.89 10/16/2017 1220   CALCIUM 9.5 10/16/2017 1220   PROT 7.3 10/16/2017 1220   ALBUMIN 4.5 10/16/2017 1220   AST 18 10/16/2017 1220   ALT 14 10/16/2017 1220   ALKPHOS 89 10/16/2017 1220   BILITOT 0.4 10/16/2017 1220   GFRNONAA >60 10/16/2017 1220   GFRAA >60 10/16/2017 1220    No results found for: TOTALPROTELP, ALBUMINELP, A1GS, A2GS, BETS, BETA2SER, GAMS, MSPIKE, SPEI  No results found for: KPAFRELGTCHN, LAMBDASER, [facility]  Lab Results  Component Value Date   WBC 6.2 10/16/2017   NEUTROABS 3.4 10/16/2017   HGB 14.7 10/16/2017   HCT 43.9 10/16/2017   MCV 90.7 10/16/2017   PLT 217 10/16/2017    _0 @  No results found for: LABCA2  No components found for: PNTIRW431  No results for input(s): INR in the last 168 hours.  No  results found for: LABCA2  No results found for: VQM086  No results found for: PYP950  No results found for: DTO671  No results found for: CA2729  No components found for: HGQUANT  No results found for: CEA1 / No results found for: CEA1   No results found for: AFPTUMOR  No results found for: CHROMOGRNA  No results found for: PSA1  No visits with results within 3 Day(s) from this visit.  Latest known visit with results is:  Appointment on 10/16/2017  Component Date Value Ref Range Status  . WBC Count 10/16/2017 6.2  3.9 - 10.3 K/uL Final  . RBC 10/16/2017 4.85  3.70 - 5.45 MIL/uL Final  . Hemoglobin 10/16/2017 14.7  11.6 - 15.9 g/dL Final  . HCT 10/16/2017 43.9  34.8 - 46.6 % Final  . MCV 10/16/2017 90.7  79.5 - 101.0 fL Final  . MCH 10/16/2017 30.3  25.1 - 34.0 pg Final  . MCHC 10/16/2017 33.4  31.5 - 36.0 g/dL Final  . RDW 10/16/2017 12.4  11.2 - 14.5 % Final  . Platelet Count 10/16/2017 217  145 - 400 K/uL Final  . Neutrophils Relative % 10/16/2017 53  % Final  . Neutro Abs 10/16/2017 3.4  1.5 - 6.5 K/uL Final  . Lymphocytes Relative 10/16/2017 38  % Final  . Lymphs Abs 10/16/2017 2.3  0.9 - 3.3 K/uL Final  . Monocytes Relative 10/16/2017 7  % Final  . Monocytes Absolute 10/16/2017 0.4  0.1 - 0.9 K/uL Final  . Eosinophils Relative 10/16/2017 1  % Final  . Eosinophils Absolute 10/16/2017 0.0  0.0 - 0.5 K/uL Final  . Basophils Relative 10/16/2017 1  % Final  . Basophils Absolute 10/16/2017 0.0  0.0 - 0.1 K/uL Final   Performed at [facility], [place] [address] [place], [place] [address]  . Sodium 10/16/2017 141  135 - 145 mmol/L Final   Please note reference intervals were recently updated.  . Potassium 10/16/2017 3.9  3.5 - 5.1 mmol/L Final  . Chloride 10/16/2017 103  98 - 111 mmol/L Final  . CO2 10/16/2017 30  22 - 32 mmol/L Final  . Glucose, Bld 10/16/2017 103* 70 - 99 mg/dL Final  . BUN 10/16/2017 14  6 - 20 mg/dL Final   Please note  change in reference range.  . Creatinine 10/16/2017 0.89  0.44 - 1.00 mg/dL Final  . Calcium 10/16/2017 9.5  8.9 - 10.3 mg/dL Final  . Total Protein 10/16/2017 7.3  6.5 - 8.1 g/dL Final  . Albumin 10/16/2017  4.5  3.5 - 5.0 g/dL Final  . AST 10/16/2017 18  15 - 41 U/L Final  . ALT 10/16/2017 14  0 - 44 U/L Final  . Alkaline Phosphatase 10/16/2017 89  38 - 126 U/L Final  . Total Bilirubin 10/16/2017 0.4  0.3 - 1.2 mg/dL Final  . GFR, Est Non Af Am 10/16/2017 >60  >60 mL/min Final  . GFR, Est AFR Am 10/16/2017 >60  >60 mL/min Final   Comment: (NOTE) The eGFR has been calculated using the CKD EPI equation. This calculation has not been validated in all clinical situations. eGFR's persistently <60 mL/min signify possible Chronic Kidney Disease.   Georgiann Hahn gap 10/16/2017 8  5 - 15 Final   Performed at [facility], [place] [address] [place], [place] [address]    (this displays the last labs from the last 3 days)  No results found for: TOTALPROTELP, ALBUMINELP, A1GS, A2GS, BETS, BETA2SER, GAMS, MSPIKE, SPEI (this displays SPEP labs)  No results found for: KPAFRELGTCHN, LAMBDASER, KAPLAMBRATIO (kappa/lambda light chains)  No results found for: HGBA, HGBA2QUANT, HGBFQUANT, HGBSQUAN (Hemoglobinopathy evaluation)   No results found for: LDH  No results found for: IRON, TIBC, IRONPCTSAT (Iron and TIBC)  No results found for: FERRITIN  Urinalysis No results found for: COLORURINE, APPEARANCEUR, LABSPEC, PHURINE, GLUCOSEU, HGBUR, BILIRUBINUR, KETONESUR, PROTEINUR, UROBILINOGEN, NITRITE, LEUKOCYTESUR   STUDIES: No results found.  ELIGIBLE FOR AVAILABLE RESEARCH PROTOCOL: not interested in COMET  ASSESSMENT: 57 y.o. [place],  woman status post left breast upper outer quadrant biopsy 10/09/2017 for ductal carcinoma in situ, grade 2, estrogen and progesterone receptor negative.  (1) left lumpectomy 10/23/2017 showed only the healing biopsy site, no residual  malignancy  (2) adjuvant radiation (12/11/2017-01/08/2018) Site/dose:   1. Left breast, 2.67 Gy x 15 fractions for a total dose of 40.05 Gy                      2. Left breast boost, 2 Gy x  5 fractions for a total dose of 10 Gy  (3) genetics testing 10/18/2017 offered through [facility] Common Hereditary Cancer Panel showed no deleterious mutations in: APC, ATM, AXIN2, BARD1, BMPR1A, BRCA1, BRCA2, BRIP1, CDH1, CDK4, CDKN2A (p14ARF), CDKN2A (p16INK4a), CHEK2, CTNNA1, DICER1, EPCAM*, GREM1*, KIT, MEN1, MLH1, MSH2, MSH3, MSH6, MUTYH, NBN, NF1, PALB2, PDGFRA, PMS2, POLD1,POLE, PTEN, RAD50, RAD51C, RAD51D, SDHB, SDHC, SDHD, SMAD4, SMARCA4, STK11, TP53, TSC1, TSC2, VHL The following genes were evaluated for sequence changes only: HOXB13*, NTHL1*, SDHA  (4) breast cancer high risk (24% lifetime risk of developing another breast cancer)  (a) considered prophylactic antiestrogens: opted against  (b) opted for intensified screening   (i) MRI yearly (December)   (ii) mammography with tomography yearly (June)   (iii) bi-annual breast exam   PLAN: Alanah did very well with her surgery and radiation and has made a very good recovery.  She is here today to discuss the fact that she is at high risk for developing another breast cancer in either breast.  That risk approaches 1 %/year, and given her age and the average survival of women these days, it is in the 25% range.  Note that her family is very long lived--her father just turned 95.  There are 2 ways of dealing with this.  One is antiestrogens, which cut the risk in half.  We discussed that at length today.  After that discussion she does not think she wants to have to deal with the multiple possible side  effects of those agents.  She is interested in intensified screening.  Note that her breast cancer was discovered 2 months after a "normal" mammogram.  We discussed the fact that breast MRI is very sensitive, and therefore may find irregularities that  may not be cancer but may require biopsy or other procedures.  With that understanding the plan will be for her to have a breast MRI every December and breast mammography every June.  If she sees her gynecologist in February and has a breast exam then, she can see me in July or August and have a second breast exam, that way she is optimally screened.  I have entered the appropriate orders.  She will let me know if she does not hear from the MRI folks within the next few weeks.  She will see me again in July of next year  She knows to call for any other issues that may develop before the next visit.    Leonard Hendler, Virgie Dad, MD  02/05/18 11:57 AM Medical Oncology and Hematology [facility] [address] [place], [place] [address] Tel. [phone]    Fax. [phone]  Alice Rieger, am acting as scribe for Chauncey Cruel MD.  I, Lurline Del MD, have reviewed the above documentation for accuracy and completeness, and I agree with the above.

## 2018-02-05 ENCOUNTER — Inpatient Hospital Stay: Payer: 59 | Attending: Oncology | Admitting: Oncology

## 2018-02-05 ENCOUNTER — Encounter: Payer: Self-pay | Admitting: Radiation Oncology

## 2018-02-05 ENCOUNTER — Telehealth: Payer: Self-pay | Admitting: Oncology

## 2018-02-05 VITALS — BP 130/77 | HR 56 | Temp 98.3°F | Resp 18 | Ht 65.0 in | Wt 137.7 lb

## 2018-02-05 DIAGNOSIS — D0512 Intraductal carcinoma in situ of left breast: Secondary | ICD-10-CM | POA: Insufficient documentation

## 2018-02-05 DIAGNOSIS — Z170 Estrogen receptor positive status [ER+]: Secondary | ICD-10-CM | POA: Insufficient documentation

## 2018-02-05 DIAGNOSIS — Z79899 Other long term (current) drug therapy: Secondary | ICD-10-CM | POA: Insufficient documentation

## 2018-02-05 DIAGNOSIS — Z807 Family history of other malignant neoplasms of lymphoid, hematopoietic and related tissues: Secondary | ICD-10-CM | POA: Insufficient documentation

## 2018-02-05 DIAGNOSIS — Z803 Family history of malignant neoplasm of breast: Secondary | ICD-10-CM | POA: Insufficient documentation

## 2018-02-05 DIAGNOSIS — Z1239 Encounter for other screening for malignant neoplasm of breast: Secondary | ICD-10-CM

## 2018-02-05 DIAGNOSIS — Z923 Personal history of irradiation: Secondary | ICD-10-CM

## 2018-02-05 NOTE — Telephone Encounter (Signed)
Per 10/16 decline avs and calendar

## 2018-02-05 NOTE — Progress Notes (Signed)
Rebekah Green presents for follow up of radiation completed 01/08/18 to her Left Breast. She saw Dr. Jana Hakim last on 02/05/18. She has declined taking antiestrogen pills, and will have yearly MRI's and yearly mammogram to evaluate for breast cancer. She will see Dr. Jana Hakim again July 2020. She is doing well after radiation. Her skin has healed well. She is not using any cream currently, but has been advised to use a lotion containing vitamin E.  BP 113/77 (BP Location: Right Arm, Patient Position: Sitting)   Pulse (!) 58   Temp 98 F (36.7 C) (Oral)   Resp 18   Ht 5' 5.5" (1.664 m)   Wt 140 lb 2 oz (63.6 kg)   LMP 08/20/2014   SpO2 100%   BMI 22.96 kg/m    Wt Readings from Last 3 Encounters:  02/14/18 140 lb 2 oz (63.6 kg)  02/05/18 137 lb 11.2 oz (62.5 kg)  11/08/17 139 lb 12.8 oz (63.4 kg)

## 2018-02-14 ENCOUNTER — Other Ambulatory Visit: Payer: Self-pay

## 2018-02-14 ENCOUNTER — Encounter: Payer: Self-pay | Admitting: Radiation Oncology

## 2018-02-14 ENCOUNTER — Ambulatory Visit
Admission: RE | Admit: 2018-02-14 | Discharge: 2018-02-14 | Disposition: A | Discharge status: Home / Self Care | Payer: 59 | Source: Ambulatory Visit | Attending: Radiation Oncology | Admitting: Radiation Oncology

## 2018-02-14 DIAGNOSIS — Z923 Personal history of irradiation: Secondary | ICD-10-CM | POA: Diagnosis not present

## 2018-02-14 DIAGNOSIS — D0512 Intraductal carcinoma in situ of left breast: Secondary | ICD-10-CM | POA: Insufficient documentation

## 2018-02-14 DIAGNOSIS — Z79899 Other long term (current) drug therapy: Secondary | ICD-10-CM | POA: Diagnosis not present

## 2018-02-14 HISTORY — DX: Personal history of irradiation: Z92.3

## 2018-02-14 NOTE — Progress Notes (Signed)
  Radiation Oncology         [phone] ________________________________  Name: Rebekah Green MRN: [id number]  Date: 02/14/2018  DOB: 29-Nov-1960  Follow-Up Visit Note  Outpatient  CC: Celene Squibb, MD  Magrinat, Virgie Dad, MD  Diagnosis and Prior Radiotherapy:    ICD-10-CM   1. Ductal carcinoma in situ (DCIS) of left breast D05.12     DCIS, ER-, PR-, left breast, intermediate grade  12/11/2017-01/08/2018: Left breast, 2.67 Gy x 15 fractions for a total dose of 40.05 Gy Left breast boost, 2 Gy x  5 fractions for a total dose of 10 Gy  CHIEF COMPLAINT: Here for follow-up and surveillance of left breast cancer  Narrative:  The patient returns today for routine follow-up of radiation completed 01/08/18 to her left breast. She last saw Dr. Jana Hakim on 02/05/18. She has declined taking antiestrogen pills, and will have yearly MRI's and yearly mammogram to evaluate for breast cancer. She will see Dr. Jana Hakim again in July 2020.   She is accompanied by her husband today. She is doing well after radiation. Her skin has healed well. She is not using any cream currently, but was advised to use a lotion containing vitamin E.                               ALLERGIES:  has No Known Allergies.  Meds: Current Outpatient Medications  Medication Sig Dispense Refill  . Ascorbic Acid (VITAMIN C) 1000 MG tablet Take 1,000 mg by mouth daily.    . Cholecalciferol (VITAMIN D) 2000 UNITS tablet Take 2,000 Units by mouth every morning.      . Multiple Vitamins-Minerals (MULTIVITAMINS THER. W/MINERALS) TABS Take 1 tablet by mouth every morning.       No current facility-administered medications for this encounter.     Physical Findings: The patient is in no acute distress. Patient is alert and oriented.  height is 5' 5.5" (1.664 m) and weight is 140 lb 2 oz (63.6 kg). Her oral temperature is 98 F (36.7 C). Her blood pressure is 113/77 and her pulse is 58 (abnormal). Her respiration is 18 and oxygen  saturation is 100%. .    Satisfactory skin healing in radiotherapy fields. No obvious signs of prior radiation.   Lab Findings: Lab Results  Component Value Date   WBC 6.2 10/16/2017   HGB 14.7 10/16/2017   HCT 43.9 10/16/2017   MCV 90.7 10/16/2017   PLT 217 10/16/2017    Radiographic Findings: No results found.  Impression/Plan: Healing well from radiotherapy to the breast tissue.  Continue skin care with topical Vitamin E Oil and / or lotion for at least 2 more months for further healing.  I encouraged her to continue with breast imaging and followup with medical oncology. I will see her back on an as-needed basis. I have encouraged her to call if she has any issues or concerns in the future. I wished her the very best.   _____________________________________   Eppie Gibson, MD   This document serves as a record of services personally performed by Eppie Gibson, MD. It was created on her behalf by Arlyce Harman, a trained medical scribe. The creation of this record is based on the scribe's personal observations and the provider's statements to them. This document has been checked and approved by the attending provider.

## 2018-03-28 ENCOUNTER — Ambulatory Visit
Admission: RE | Admit: 2018-03-28 | Discharge: 2018-03-28 | Disposition: A | Discharge status: Home / Self Care | Payer: 59 | Source: Ambulatory Visit | Attending: Oncology | Admitting: Oncology

## 2018-03-28 DIAGNOSIS — D0512 Intraductal carcinoma in situ of left breast: Secondary | ICD-10-CM

## 2018-03-28 DIAGNOSIS — Z1239 Encounter for other screening for malignant neoplasm of breast: Secondary | ICD-10-CM

## 2018-03-28 MED ORDER — GADOBUTROL 1 MMOL/ML IV SOLN
6.0000 mL | Freq: Once | INTRAVENOUS | Status: AC | PRN
  Administered 2018-03-28: 6 mL via INTRAVENOUS

## 2018-03-31 ENCOUNTER — Encounter: Payer: Self-pay | Admitting: Oncology

## 2018-04-02 ENCOUNTER — Telehealth: Payer: Self-pay

## 2018-04-02 NOTE — Telephone Encounter (Signed)
Returned patient's call regarding MRI results.  Reviewed results with patient.  No further needs at this time.

## 2018-04-24 ENCOUNTER — Telehealth: Payer: Self-pay | Admitting: Oncology

## 2018-04-24 NOTE — Telephone Encounter (Signed)
Left voicemail for patient re appt that was changed. Sent confirmation letter in the mail.

## 2018-05-02 ENCOUNTER — Encounter: Payer: 59 | Admitting: Adult Health

## 2018-05-07 ENCOUNTER — Telehealth: Payer: Self-pay

## 2018-05-07 NOTE — Telephone Encounter (Signed)
LVM for patient reminding of SCP visit with NP on 05/14/18 at 10 am.  Center number left for call back if needed.

## 2018-05-14 ENCOUNTER — Inpatient Hospital Stay: Payer: 59 | Admitting: Adult Health

## 2018-07-18 ENCOUNTER — Other Ambulatory Visit: Payer: Self-pay | Admitting: Oncology

## 2018-07-18 DIAGNOSIS — Z853 Personal history of malignant neoplasm of breast: Secondary | ICD-10-CM

## 2018-08-15 ENCOUNTER — Other Ambulatory Visit: Payer: Self-pay

## 2018-08-15 ENCOUNTER — Ambulatory Visit
Admission: RE | Admit: 2018-08-15 | Discharge: 2018-08-15 | Disposition: A | Discharge status: Home / Self Care | Payer: 59 | Source: Ambulatory Visit | Attending: Oncology | Admitting: Oncology

## 2018-08-15 DIAGNOSIS — Z853 Personal history of malignant neoplasm of breast: Secondary | ICD-10-CM

## 2018-08-28 ENCOUNTER — Other Ambulatory Visit: Payer: Self-pay | Admitting: Oncology

## 2018-08-28 DIAGNOSIS — Z803 Family history of malignant neoplasm of breast: Secondary | ICD-10-CM

## 2018-08-28 NOTE — Progress Notes (Signed)
I entered an order for breast MRI in October and visit in November.  I canceled the visit in June.

## 2018-09-11 ENCOUNTER — Telehealth: Payer: Self-pay

## 2018-09-11 NOTE — Telephone Encounter (Signed)
Patient aware that MD cancelled her visit this July for follow up, she will have an MRI in October 2020 and she was made aware of her new appt for 02/26/2019 at 0930 for MD follow up. I let her know that it is too early to hear from MRI about the date of that yet but if she does not hear from anyone by the first of September to call here and inquire. She agreed.

## 2018-11-06 ENCOUNTER — Ambulatory Visit: Payer: 59 | Admitting: Oncology

## 2018-11-07 ENCOUNTER — Ambulatory Visit: Payer: 59 | Admitting: Oncology

## 2019-02-13 ENCOUNTER — Ambulatory Visit
Admission: RE | Admit: 2019-02-13 | Discharge: 2019-02-13 | Disposition: A | Discharge status: Home / Self Care | Payer: 59 | Source: Ambulatory Visit | Attending: Oncology | Admitting: Oncology

## 2019-02-13 DIAGNOSIS — Z803 Family history of malignant neoplasm of breast: Secondary | ICD-10-CM

## 2019-02-13 MED ORDER — GADOBUTROL 1 MMOL/ML IV SOLN
6.0000 mL | Freq: Once | INTRAVENOUS | Status: AC | PRN
  Administered 2019-02-13: 10:00:00 6 mL via INTRAVENOUS

## 2019-02-15 ENCOUNTER — Encounter: Payer: Self-pay | Admitting: Oncology

## 2019-02-25 NOTE — Progress Notes (Signed)
[facility]  [phone] [phone]     ID: ASHNA DOROUGH DOB: [birth year]  MR#: [id number]  WUG#:[id number]  Patient Care Team: Celene Squibb, MD as PCP - General (Internal Medicine) [name], MD as Consulting Physician (General Surgery) Kelseigh Diver, Virgie Dad, MD as Consulting Physician (Oncology) Eppie Gibson, MD as Attending Physician (Radiation Oncology) Allyn Kenner, MD as Referring Physician (Dermatology) Key, Nelia Shi, NP as Nurse Practitioner (Gynecology) Delice Bison, Charlestine Massed, NP as Nurse Practitioner (Hematology and Oncology) OTHER MD:  CHIEF COMPLAINT: 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  Since her last visit, she underwent bilateral diagnostic mammography with tomography at [facility] on 08/15/2018 showing: breast density category C; no evidence of malignancy in either breast.  She also underwent intensified surveillance bilateral breast MRI on 02/13/2019, which showed: breast composition C; no evidence of malignancy in either breast.   REVIEW OF SYSTEMS: Rebekah Green is very concerned about her chest lesion, which has been followed by Dr.Hall, with biopsy a squamous proliferation.  It could not be otherwise classified and she is scheduled for full excision.  Otherwise she is exercising regularly.  She is following appropriate pandemic precautions.  A detailed review of systems was noncontributory except as noted   HISTORY OF CURRENT ILLNESS: From the original intake note:  "Rebekah Green" had routine screening mammography on 06/28/2017 showing no evidence of malignancy in either breast. However, she subsequently developed a palpable abnormality in the left breast. She underwent unilateral left diagnostic mammography with tomography and left breast  ultrasonography at [facility] on 10/09/2017 showing: breast density category C. At the 3 o'clock position upper outer quadrant of the left breast, there is an oval partially obscured mass with grouped calcification measuring 0.4 x 0.3 cm. By ultrasound, there was a 2.1 cm mass mass consistent with benign cyst and was hypoechoic with anechoic portions. The right axilla was negative for lymphadenopathy. Unilateral right diagnostic mammography showed no evidence of malignancy in the right breast.   Accordingly on 10/09/2017 she proceeded to biopsy of the left breast area in question. The pathology from this procedure showed ([id number]): Ductal carcinoma in situ, intermediate grade, with calcifications. Prognostic indicators significant for: both estrogen and progesterone receptor, 0% negative.  The cyst was drained with no residual postprocedure.   The patient's subsequent history is as detailed below.   PAST MEDICAL HISTORY: Past Medical History:  Diagnosis Date  . Cancer ([place]) 09/2017   DCIS left breast  . Family history of breast cancer   . History of radiation therapy 12/11/17- 01/08/18   Left Breast 15 fractions for a total dose of 40.05 Gy. Left breast boost 5 fractions for a total dose of 10 Gy.      PAST SURGICAL HISTORY: Past Surgical History:  Procedure Laterality Date  . BREAST CYST ASPIRATION    . BREAST EXCISIONAL BIOPSY     unsure of breast, no scar  . BREAST LUMPECTOMY Left 2019  . BREAST LUMPECTOMY WITH RADIOACTIVE SEED LOCALIZATION Left 10/23/2017   Procedure: BREAST LUMPECTOMY WITH RADIOACTIVE SEED LOCALIZATION;  Surgeon: [name], MD;  Location: [facility];  Service: General;  Laterality: Left;  Rebekah Green Kitchen MASS EXCISION  03/30/2011   Procedure: EXCISION MASS;  Surgeon: Donato Heinz;  Location: [facility];  Service: General;  Laterality: N/A;  Excision Soft Tissue  Mass Abdominal Wall    FAMILY HISTORY Family History  Problem Relation Age of Onset  . Breast  cancer Mother 70  . Non-Hodgkin's lymphoma Brother 64       B cell  . Breast cancer Cousin 40  . Breast cancer Other        dx late 20's/ 30's.   . Anesthesia problems Neg Hx   . Hypotension Neg Hx   . Malignant hyperthermia Neg Hx   . Pseudochol deficiency Neg Hx   As of June 2019, the patient's father is alive at age 60. The patient's mother is alive at age 94, with a history of breast cancer diagnosed at age 82. The patient has 1 brother and 2 sisters. The patient's brother was diagnosed with non- Hodgkin's lymphoma remotely. There was a 1st cousin with breast cancer diagnosed in the late 50's. The patient's grandmother's sister also had breast cancer. The patient denies a family history of ovarian cancer.    GYNECOLOGIC HISTORY:  Patient's last menstrual period was 08/20/2014. 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  Age at first live birth: 58 years old She is GXP1. Her LMP was April 2017. She used oral contraceptive for 5 years with no complications. She never used HRT.   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 The patient is married to Mound "Rebekah" Laurance Green, who is an EHS (environmental health and safety) specialist. Their son, Rebekah Green Kitchen "Rebekah Green" is 6 lives in [place] and works as a Government social research officer for [employer], IT trainer stations.      ADVANCED DIRECTIVES:    HEALTH MAINTENANCE: Social History   Tobacco Use  . Smoking status: Never Smoker  . Smokeless tobacco: Never Used  Substance Use Topics  . Alcohol use: Yes    Comment: rare  . Drug use: No     Colonoscopy: 2010  PAP: 08/2017  Bone density: 2008 - scheduled 10/18/2017   No Known Allergies  Current Outpatient Medications  Medication Sig Dispense Refill  . Ascorbic Acid (VITAMIN C) 1000 MG tablet Take 1,000 mg by mouth daily.    . Cholecalciferol (VITAMIN D) 2000 UNITS tablet Take 2,000 Units by mouth every morning.      . Multiple  Vitamins-Minerals (MULTIVITAMINS THER. W/MINERALS) TABS Take 1 tablet by mouth every morning.       No current facility-administered medications for this visit.     OBJECTIVE: Middle-aged white woman in no acute distress Vitals:   02/26/19 0952  BP: 117/69  Pulse: (!) 58  Resp: 18  Temp: 98.7 F (37.1 C)  SpO2: 100%     Body mass index is 23.12 kg/m.   Wt Readings from Last 3 Encounters:  02/26/19 141 lb 1.6 oz (64 kg)  02/14/18 140 lb 2 oz (63.6 kg)  02/05/18 137 lb 11.2 oz (62.5 kg)      ECOG FS:0 - Asymptomatic  Sclerae unicteric, EOMs intact Wearing a mask No cervical or supraclavicular adenopathy Lungs no rales or rhonchi Heart regular rate and rhythm Abd soft, nontender, positive bowel sounds MSK no focal spinal tenderness, no upper extremity lymphedema Neuro: nonfocal, well oriented, appropriate affect Breasts: The right breast is unremarkable.  The left breast is status post lumpectomy.  There is no evidence of local recurrence.  Both axillae are benign. Skin: The lesion in the mid chest, a little bit to the left (the photo below is upside down) is imaged below.  Skin lesion  02/26/2019    LAB RESULTS:  CMP     Component Value Date/Time   NA 141 10/16/2017 1220   K 3.9 10/16/2017 1220   CL 103 10/16/2017 1220   CO2 30 10/16/2017 1220   GLUCOSE 103 (H) 10/16/2017 1220   BUN 14 10/16/2017 1220   CREATININE 0.89 10/16/2017 1220   CALCIUM 9.5 10/16/2017 1220   PROT 7.3 10/16/2017 1220   ALBUMIN 4.5 10/16/2017 1220   AST 18 10/16/2017 1220   ALT 14 10/16/2017 1220   ALKPHOS 89 10/16/2017 1220   BILITOT 0.4 10/16/2017 1220   GFRNONAA >60 10/16/2017 1220   GFRAA >60 10/16/2017 1220    No results found for: TOTALPROTELP, ALBUMINELP, A1GS, A2GS, BETS, BETA2SER, GAMS, MSPIKE, SPEI  No results found for: KPAFRELGTCHN, LAMBDASER, KAPLAMBRATIO  Lab Results  Component Value Date   WBC 6.2 10/16/2017   NEUTROABS 3.4 10/16/2017   HGB 14.7 10/16/2017   HCT  43.9 10/16/2017   MCV 90.7 10/16/2017   PLT 217 10/16/2017    _0 @  No results found for: LABCA2  No components found for: QMVHQI696  No results for input(s): INR in the last 168 hours.  No results found for: LABCA2  No results found for: EXB284  No results found for: XLK440  No results found for: NUU725  No results found for: CA2729  No components found for: HGQUANT  No results found for: CEA1 / No results found for: CEA1   No results found for: AFPTUMOR  No results found for: CHROMOGRNA  No results found for: PSA1  No visits with results within 3 Day(s) from this visit.  Latest known visit with results is:  Appointment on 10/16/2017  Component Date Value Ref Range Status  . WBC Count 10/16/2017 6.2  3.9 - 10.3 K/uL Final  . RBC 10/16/2017 4.85  3.70 - 5.45 MIL/uL Final  . Hemoglobin 10/16/2017 14.7  11.6 - 15.9 g/dL Final  . HCT 10/16/2017 43.9  34.8 - 46.6 % Final  . MCV 10/16/2017 90.7  79.5 - 101.0 fL Final  . MCH 10/16/2017 30.3  25.1 - 34.0 pg Final  . MCHC 10/16/2017 33.4  31.5 - 36.0 g/dL Final  . RDW 10/16/2017 12.4  11.2 - 14.5 % Final  . Platelet Count 10/16/2017 217  145 - 400 K/uL Final  . Neutrophils Relative % 10/16/2017 53  % Final  . Neutro Abs 10/16/2017 3.4  1.5 - 6.5 K/uL Final  . Lymphocytes Relative 10/16/2017 38  % Final  . Lymphs Abs 10/16/2017 2.3  0.9 - 3.3 K/uL Final  . Monocytes Relative 10/16/2017 7  % Final  . Monocytes Absolute 10/16/2017 0.4  0.1 - 0.9 K/uL Final  . Eosinophils Relative 10/16/2017 1  % Final  . Eosinophils Absolute 10/16/2017 0.0  0.0 - 0.5 K/uL Final  . Basophils Relative 10/16/2017 1  % Final  . Basophils Absolute 10/16/2017 0.0  0.0 - 0.1 K/uL Final   Performed at [facility], [place] [address] [place], [place] [address]  . Sodium 10/16/2017 141  135 - 145 mmol/L Final   Please note reference intervals were recently updated.  . Potassium 10/16/2017 3.9  3.5 - 5.1 mmol/L  Final  . Chloride 10/16/2017 103  98 - 111 mmol/L Final  . CO2 10/16/2017 30  22 - 32 mmol/L Final  . Glucose, Bld 10/16/2017 103* 70 - 99 mg/dL Final  . BUN 10/16/2017 14  6 - 20 mg/dL Final   Please note  change in reference range.  . Creatinine 10/16/2017 0.89  0.44 - 1.00 mg/dL Final  . Calcium 10/16/2017 9.5  8.9 - 10.3 mg/dL Final  . Total Protein 10/16/2017 7.3  6.5 - 8.1 g/dL Final  . Albumin 10/16/2017 4.5  3.5 - 5.0 g/dL Final  . AST 10/16/2017 18  15 - 41 U/L Final  . ALT 10/16/2017 14  0 - 44 U/L Final  . Alkaline Phosphatase 10/16/2017 89  38 - 126 U/L Final  . Total Bilirubin 10/16/2017 0.4  0.3 - 1.2 mg/dL Final  . GFR, Est Non Af Am 10/16/2017 >60  >60 mL/min Final  . GFR, Est AFR Am 10/16/2017 >60  >60 mL/min Final   Comment: (NOTE) The eGFR has been calculated using the CKD EPI equation. This calculation has not been validated in all clinical situations. eGFR's persistently <60 mL/min signify possible Chronic Kidney Disease.   Georgiann Hahn gap 10/16/2017 8  5 - 15 Final   Performed at [facility], [place] [address] [place], [place] [address]    (this displays the last labs from the last 3 days)  No results found for: TOTALPROTELP, ALBUMINELP, A1GS, A2GS, BETS, BETA2SER, GAMS, MSPIKE, SPEI (this displays SPEP labs)  No results found for: KPAFRELGTCHN, LAMBDASER, KAPLAMBRATIO (kappa/lambda light chains)  No results found for: HGBA, HGBA2QUANT, HGBFQUANT, HGBSQUAN (Hemoglobinopathy evaluation)   No results found for: LDH  No results found for: IRON, TIBC, IRONPCTSAT (Iron and TIBC)  No results found for: FERRITIN  Urinalysis No results found for: COLORURINE, APPEARANCEUR, LABSPEC, PHURINE, GLUCOSEU, HGBUR, BILIRUBINUR, KETONESUR, PROTEINUR, UROBILINOGEN, NITRITE, LEUKOCYTESUR   STUDIES: Mr Breast Bilateral W [place] Cad  Result Date: 02/13/2019 CLINICAL DATA:  58 year old patient with personal history of ductal carcinoma in  situ of the left breast with lumpectomy and radiation therapy in 2019. LABS:  Creatinine was obtained on site at [facility] at [address] Results: Creatinine 0.9 mg/dL. EXAM: BILATERAL BREAST MRI WITH AND WITHOUT CONTRAST TECHNIQUE: Multiplanar, multisequence MR images of both breasts were obtained prior to and following the intravenous administration of 6 ml of Gadavist Three-dimensional MR images were rendered by post-processing of the original MR data on an independent workstation. The three-dimensional MR images were interpreted, and findings are reported in the following complete MRI report for this study. Three dimensional images were evaluated at the independent DynaCad workstation COMPARISON:  Mammogram August 15, 2018.  Breast MRI March 28, 2018 FINDINGS: Breast composition: c. Heterogeneous fibroglandular tissue. Background parenchymal enhancement: Mild Right breast: No mass or abnormal enhancement. Left breast: Lumpectomy changes in the superior left breast. No mass or abnormal enhancement. Lymph nodes: No abnormal appearing lymph nodes. Ancillary findings:  None. IMPRESSION: No MRI evidence of malignancy in either breast. RECOMMENDATION: Annual diagnostic bilateral mammogram is recommended in April 2021. BI-RADS CATEGORY  2: Benign. Electronically Signed   By: Curlene Dolphin M.D.   On: 02/13/2019 13:47    ELIGIBLE FOR AVAILABLE RESEARCH PROTOCOL: not interested in COMET  ASSESSMENT: 58 y.o. [place], [place] woman status post left breast upper outer quadrant biopsy 10/09/2017 for ductal carcinoma in situ, grade 2, estrogen and progesterone receptor negative.  (1) left lumpectomy 10/23/2017 showed only the healing biopsy site, no residual malignancy  (2) adjuvant radiation (12/11/2017-01/08/2018) Site/dose:   1. Left breast, 2.67 Gy x 15 fractions for a total dose of 40.05 Gy  2. Left breast boost, 2 Gy x  5 fractions for a total dose of 10 Gy  (3) genetics  testing 10/18/2017 offered through [facility] Common Hereditary Cancer Panel showed no deleterious mutations in: APC, ATM, AXIN2, BARD1, BMPR1A, BRCA1, BRCA2, BRIP1, CDH1, CDK4, CDKN2A (p14ARF), CDKN2A (p16INK4a), CHEK2, CTNNA1, DICER1, EPCAM*, GREM1*, KIT, MEN1, MLH1, MSH2, MSH3, MSH6, MUTYH, NBN, NF1, PALB2, PDGFRA, PMS2, POLD1,POLE, PTEN, RAD50, RAD51C, RAD51D, SDHB, SDHC, SDHD, SMAD4, SMARCA4, STK11, TP53, TSC1, TSC2, VHL The following genes were evaluated for sequence changes only: HOXB13*, NTHL1*, SDHA  (4) breast cancer high risk (24% lifetime risk of developing another breast cancer)  (a) considered prophylactic antiestrogens: opted against  (b) opted for intensified screening   (i) MRI yearly (December)   (ii) mammography with tomography yearly (June)   (iii) bi-annual breast exam   PLAN: Kaeleen is continuing with intensified screening and will have her next mammogram in 6 months.  We looked at the MRI that she had recently and it does suggest barely show the skin lesion, which now shows no evidence of significant infiltration of the underlying tissue.  She is scheduled for excision and we will have more information on the pathology after that  She understands that squamous cell carcinoma of the skin is generally not a major issue but this is in the radiation field, I believe, and so it is of more concern  Otherwise she will be set up for mammography in April 2021 and repeat breast MRI October 2021  She knows to call for any other issue that may develop before the next visit.     Karagan Lehr, Virgie Dad, MD  02/26/19 5:56 PM Medical Oncology and Hematology [facility] [place], [place] [address] Tel. [phone]    Fax. [phone]   I, Wilburn Mylar, am acting as scribe for Dr. Virgie Dad. Simon Aaberg.  I, Lurline Del MD, have reviewed the above documentation for accuracy and completeness, and I agree with the above.

## 2019-02-26 ENCOUNTER — Other Ambulatory Visit: Payer: Self-pay

## 2019-02-26 ENCOUNTER — Inpatient Hospital Stay: Payer: 59 | Attending: Oncology | Admitting: Oncology

## 2019-02-26 VITALS — BP 117/69 | HR 58 | Temp 98.7°F | Resp 18 | Ht 65.5 in | Wt 141.1 lb

## 2019-02-26 DIAGNOSIS — Z171 Estrogen receptor negative status [ER-]: Secondary | ICD-10-CM | POA: Insufficient documentation

## 2019-02-26 DIAGNOSIS — Z803 Family history of malignant neoplasm of breast: Secondary | ICD-10-CM | POA: Diagnosis not present

## 2019-02-26 DIAGNOSIS — D0512 Intraductal carcinoma in situ of left breast: Secondary | ICD-10-CM | POA: Diagnosis not present

## 2019-02-26 DIAGNOSIS — Z86000 Personal history of in-situ neoplasm of breast: Secondary | ICD-10-CM | POA: Diagnosis not present

## 2019-02-26 DIAGNOSIS — Z923 Personal history of irradiation: Secondary | ICD-10-CM | POA: Insufficient documentation

## 2019-02-26 DIAGNOSIS — Z807 Family history of other malignant neoplasms of lymphoid, hematopoietic and related tissues: Secondary | ICD-10-CM | POA: Insufficient documentation

## 2019-03-13 ENCOUNTER — Other Ambulatory Visit: Payer: Self-pay | Admitting: General Surgery

## 2019-04-22 ENCOUNTER — Encounter: Payer: Self-pay | Admitting: Gastroenterology

## 2019-05-26 ENCOUNTER — Ambulatory Visit: Payer: 59

## 2019-08-04 ENCOUNTER — Other Ambulatory Visit: Payer: Self-pay

## 2019-08-04 ENCOUNTER — Ambulatory Visit (INDEPENDENT_AMBULATORY_CARE_PROVIDER_SITE_OTHER): Payer: Self-pay | Admitting: *Deleted

## 2019-08-04 DIAGNOSIS — Z1211 Encounter for screening for malignant neoplasm of colon: Secondary | ICD-10-CM

## 2019-08-04 MED ORDER — NA SULFATE-K SULFATE-MG SULF 17.5-3.13-1.6 GM/177ML PO SOLN: 1.0000 | Freq: Once | ORAL | 0 refills | Status: AC

## 2019-08-04 NOTE — Progress Notes (Signed)
Appropriate.

## 2019-08-04 NOTE — Patient Instructions (Signed)
Rebekah Green  03-19-1961 MRN: [id number]     Procedure Date: 09/18/2019 Time to register: 12:00 pm Place to register: [facility] Procedure Time: 1:00 pm Scheduled provider: Dr. Gala Romney    PREPARATION FOR COLONOSCOPY WITH SUPREP BOWEL PREP KIT  Note: Suprep Bowel Prep Kit is a split-dose (2day) regimen. Consumption of BOTH 6-ounce bottles is required for a complete prep.  Please notify us immediately if you are diabetic, take iron supplements, or if you are on Coumadin or any other blood thinners.  Please hold the following medications: n/a                                                                                                                                                  2 DAYS BEFORE PROCEDURE:  DATE: 09/16/2019   DAY: Wednesday Begin clear liquid diet AFTER your lunch meal. NO SOLID FOODS after this point.  1 DAY BEFORE PROCEDURE:  DATE: 09/17/2019 DAY: Thursday Continue clear liquids the entire day - NO SOLID FOOD.   Diabetic medications adjustments for today: n/a  At 6:00pm: Complete steps 1 through 4 below, using ONE (1) 6-ounce bottle, before going to bed. Step 1:  Pour ONE (1) 6-ounce bottle of SUPREP liquid into the mixing container.  Step 2:  Add cool drinking water to the 16 ounce line on the container and mix.  Note: Dilute the solution concentrate as directed prior to use. Step 3:  DRINK ALL the liquid in the container. Step 4:  You MUST drink an additional two (2) or more 16 ounce containers of water over the next one (1) hour.   Continue clear liquids.  DAY OF PROCEDURE:   DATE: 09/18/2019   DAY: Friday If you take medications for your heart, blood pressure, or breathing, you may take these medications.  Diabetic medications adjustments for today: n/a  5 hours before your procedure at 8:00 am : Step 1:  Pour ONE (1) 6-ounce bottle of SUPREP liquid into the mixing container.  Step 2:  Add cool drinking water to the 16 ounce line on the  container and mix.  Note: Dilute the solution concentrate as directed prior to use. Step 3:  DRINK ALL the liquid in the container. Step 4:  You MUST drink an additional two (2) or more 16 ounce containers of water over the next one (1) hour. You MUST complete the final glass of water at least 3 hours before your colonoscopy. Nothing by mouth past 10:00 am.   You may take your morning medications with sip of water unless we have instructed otherwise.    Please see below for Dietary Information.  CLEAR LIQUIDS INCLUDE:  Water Jello (NOT red in color)   Ice Popsicles (NOT red in color)   Tea (sugar ok, no milk/cream) Powdered fruit flavored drinks  Coffee (sugar ok, no  milk/cream) Gatorade/ Lemonade/ Kool-Aid  (NOT red in color)   Juice: apple, white grape, white cranberry Soft drinks  Clear bullion, consomme, broth (fat free beef/chicken/vegetable)  Carbonated beverages (any kind)  Strained chicken noodle soup Hard Candy   Remember: Clear liquids are liquids that will allow you to see your fingers on the other side of a clear glass. Be sure liquids are NOT red in color, and not cloudy, but CLEAR.  DO NOT EAT OR DRINK ANY OF THE FOLLOWING:  Dairy products of any kind   Cranberry juice Tomato juice / V8 juice   Grapefruit juice Orange juice     Red grape juice  Do not eat any solid foods, including such foods as: cereal, oatmeal, yogurt, fruits, vegetables, creamed soups, eggs, bread, crackers, pureed foods in a blender, etc.   HELPFUL HINTS FOR DRINKING PREP SOLUTION:   Make sure prep is extremely cold. Mix and refrigerate the the morning of the prep. You may also put in the freezer.   You may try mixing some Crystal Light or Country Time Lemonade if you prefer. Mix in small amounts; add more if necessary.  Try drinking through a straw  Rinse mouth with water or a mouthwash between glasses, to remove after-taste.  Try sipping on a cold beverage /ice/ popsicles between glasses of  prep.  Place a piece of sugar-free hard candy in mouth between glasses.  If you become nauseated, try consuming smaller amounts, or stretch out the time between glasses. Stop for 30-60 minutes, then slowly start back drinking.     OTHER INSTRUCTIONS  You will need a responsible adult at least 61 years of age to accompany you and drive you home. This person must remain in the waiting room during your procedure. The hospital will cancel your procedure if you do not have a responsible adult with you.   1. Wear loose fitting clothing that is easily removed. 2. Leave jewelry and other valuables at home.  3. Remove all body piercing jewelry and leave at home. 4. Total time from sign-in until discharge is approximately 2-3 hours. 5. You should go home directly after your procedure and rest. You can resume normal activities the day after your procedure. 6. The day of your procedure you should not:  Drive  Make legal decisions  Operate machinery  Drink alcohol  Return to work   You may call the office (Dept: [phone]) before 5:00pm, or page the doctor on call [phone]) after 5:00pm, for further instructions, if necessary.   Insurance Information YOU WILL NEED TO CHECK WITH YOUR INSURANCE COMPANY FOR THE BENEFITS OF COVERAGE YOU HAVE FOR THIS PROCEDURE.  UNFORTUNATELY, NOT ALL INSURANCE COMPANIES HAVE BENEFITS TO COVER ALL OR PART OF THESE TYPES OF PROCEDURES.  IT IS YOUR RESPONSIBILITY TO CHECK YOUR BENEFITS, HOWEVER, WE WILL BE GLAD TO ASSIST YOU WITH ANY CODES YOUR INSURANCE COMPANY MAY NEED.    PLEASE NOTE THAT MOST INSURANCE COMPANIES WILL NOT COVER A SCREENING COLONOSCOPY FOR PEOPLE UNDER THE AGE OF 50  IF YOU HAVE BCBS INSURANCE, YOU MAY HAVE BENEFITS FOR A SCREENING COLONOSCOPY BUT IF POLYPS ARE FOUND THE DIAGNOSIS WILL CHANGE AND THEN YOU MAY HAVE A DEDUCTIBLE THAT WILL NEED TO BE MET. SO PLEASE MAKE SURE YOU CHECK YOUR BENEFITS FOR A SCREENING COLONOSCOPY AS WELL AS A  DIAGNOSTIC COLONOSCOPY.

## 2019-08-04 NOTE — Progress Notes (Signed)
Gastroenterology Pre-Procedure Review  Request Date: 08/04/2019 Requesting Physician: Dr. Wende Neighbors, Last TCS done in 2010 by Dr. Earlean Shawl, no polyps per pt  PATIENT REVIEW QUESTIONS: The patient responded to the following health history questions as indicated:    1. Diabetes Melitis: no 2. Joint replacements in the past 12 months: no 3. Major health problems in the past 3 months: no 4. Has an artificial valve or MVP: no 5. Has a defibrillator: no 6. Has been advised in past to take antibiotics in advance of a procedure like teeth cleaning: no 7. Family history of colon cancer: no  8. Alcohol Use: yes, 1 glass of wine every few months 9. Illicit drug Use: no 10. History of sleep apnea:  no 11. History of coronary artery or other vascular stents placed within the last 12 months: no 12. History of any prior anesthesia complications: no 13. There is no height or weight on file to calculate BMI. ht:5'5  wt: 120 lbs    MEDICATIONS & ALLERGIES:    Patient reports the following regarding taking any blood thinners:   Plavix? no Aspirin? no Coumadin? no Brilinta? no Xarelto? no Eliquis? no Pradaxa? no Savaysa? no Effient? no  Patient confirms/reports the following medications:  Current Outpatient Medications  Medication Sig Dispense Refill  . Ascorbic Acid (VITAMIN C) 1000 MG tablet Take 1,000 mg by mouth daily.    . Cholecalciferol (VITAMIN D) 2000 UNITS tablet Take 2,000 Units by mouth every morning.      . Multiple Vitamins-Minerals (MULTIVITAMINS THER. W/MINERALS) TABS Take 1 tablet by mouth every morning.       No current facility-administered medications for this visit.    Patient confirms/reports the following allergies:  No Known Allergies  No orders of the defined types were placed in this encounter.   AUTHORIZATION INFORMATION Primary Insurance: [facility],  [place] #: [id number],  Group #: [id number] Pre-Cert / Josem Kaufmann required:  Pre-Cert / Auth #:   SCHEDULE INFORMATION: Procedure  has been scheduled as follows:  Date: 09/18/2019, Time: 1:00 Location: [facility] with Dr. Gala Romney  This Gastroenterology Pre-Precedure Review Form is being routed to the following provider(s): Roseanne Kaufman, NP

## 2019-08-13 ENCOUNTER — Telehealth: Payer: Self-pay | Admitting: *Deleted

## 2019-08-13 NOTE — Progress Notes (Signed)
Received phone call from Freda Munro at [facility] that pt's procedure authorization request was denied.  She informed me that pt's plan does not cover outpatient facility/hospital setting without peer to peer review.  They prefer for pt to have at an ambulatory surgical center.  Called pt and informed her.  She followed up with [facility] and pt would like [place] to do a peer to peer review to get authorization for her to have done at [facility].  Ref# [id number]

## 2019-08-13 NOTE — Telephone Encounter (Signed)
Received call from Rebekah Green at [facility] earlier that pt's procedure has been denied due to it being done at an outpatient facility.  Called pt and informed her of the information.  She requested that I keep her procedure date.  She is going to follow up with [facility] and will call me back.

## 2019-08-21 ENCOUNTER — Other Ambulatory Visit: Payer: Self-pay | Admitting: Oncology

## 2019-08-21 ENCOUNTER — Ambulatory Visit
Admission: RE | Admit: 2019-08-21 | Discharge: 2019-08-21 | Disposition: A | Discharge status: Home / Self Care | Payer: 59 | Source: Ambulatory Visit | Attending: Oncology | Admitting: Oncology

## 2019-08-21 ENCOUNTER — Other Ambulatory Visit: Payer: Self-pay

## 2019-08-21 DIAGNOSIS — D0512 Intraductal carcinoma in situ of left breast: Secondary | ICD-10-CM

## 2019-08-21 DIAGNOSIS — N6320 Unspecified lump in the left breast, unspecified quadrant: Secondary | ICD-10-CM

## 2019-08-21 HISTORY — DX: Personal history of irradiation: Z92.3

## 2019-08-24 NOTE — Telephone Encounter (Signed)
Pt called back and wanted to see where we were in the peer to peer process.  Informed pt that I would follow up with AB.  Pt made aware that AB is off today.  Informed pt that I would be back in touch with her.  Pt voiced understanding.

## 2019-08-25 NOTE — Telephone Encounter (Signed)
Please see triage sheet.

## 2019-08-25 NOTE — Progress Notes (Signed)
Angie, I have never called to do a peer to peer to an insurance company to obtain authorization for colonoscopy at [facility] instead of ambulatory center. Not sure what number to call, and there is not a medical reason that I can propose that would differentiate need for one facility vs the other. It would be in her best interest insurance-wise to stick with what is covered under their plan. My concern would be the cost would still be more to her overall. There are some insurances that just do not cover the outpatient facility/hospital setting.

## 2019-08-25 NOTE — Telephone Encounter (Signed)
Noted  

## 2019-08-25 NOTE — Progress Notes (Signed)
Spoke with pt and made her aware that AB thinks it would be in her best interest insurance wise to stick with what is covered under her plan.  Explained to pt that AB's main concern is cost would be more overall and some insurances just do not cover outpatient facility/hospital settings.  Pt voiced understanding and was given the number to [facility] since they have an ambulatory procedure setting.

## 2019-08-28 IMAGING — MR MR BILATERAL BREAST WITHOUT AND WITH CONTRAST
8 of 12 series · 33 of 48 positions shown · IV contrast (6 ML GADAVIST)
Comparison: 10/23/2017 and earlier

CLINICAL DATA: Intermediate grade ductal carcinoma in situ with
calcifications in the UPPER-OUTER QUADRANT of the LEFT breast.
Status post LEFT lumpectomy in October 2017 and radiation therapy in
November 2017. Family history of breast cancer diagnosed in the
patient's mother age 70. High risk.

LABS:  None obtained at the time of imaging.
EXAM:
BILATERAL BREAST MRI WITH AND WITHOUT CONTRAST
TECHNIQUE: Multiplanar, multisequence MR images of both breasts were obtained
prior to and following the intravenous administration of 6 ml of
Gadavist

[Series 4: fl3d pre-cm no · axial · non-contrast · 1.2mm · 0.89mm/px · z∈[-79,+93]mm · 5 of 144 slices shown]
[im 1/144]
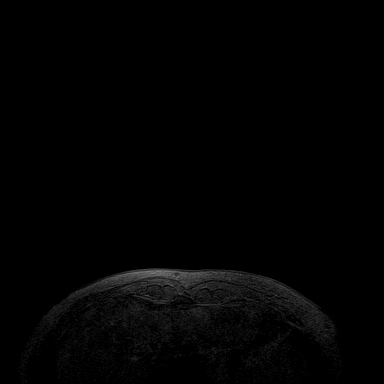
[im 36/144]
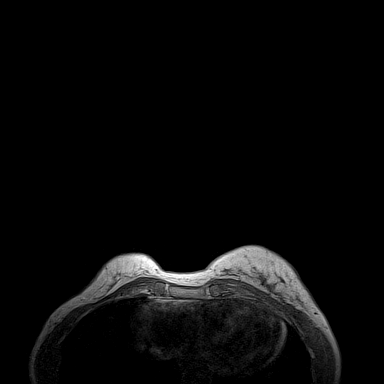
[im 72/144]
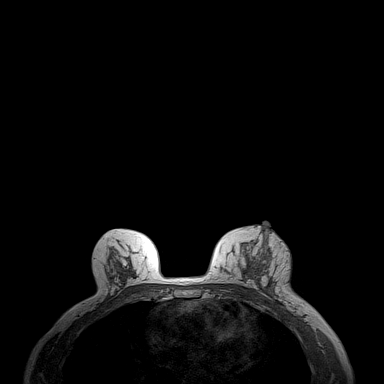
[im 108/144]
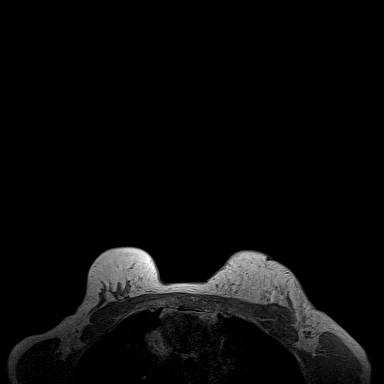
[im 144/144]
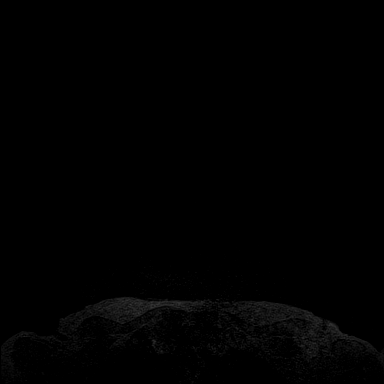

[Series 5: t2_tirm_tra ipat (a-p) · axial · 3.0mm · 0.66mm/px · 1 of 57 slices shown]
[im 1/57]
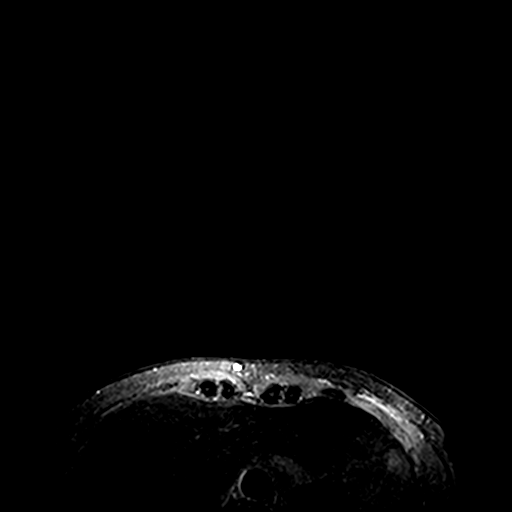

[Series 6: fl3d pre-cm · axial · non-contrast · 1.2mm · 0.89mm/px · z∈[-79,+93]mm · 5 of 144 slices shown]
[im 1/144]
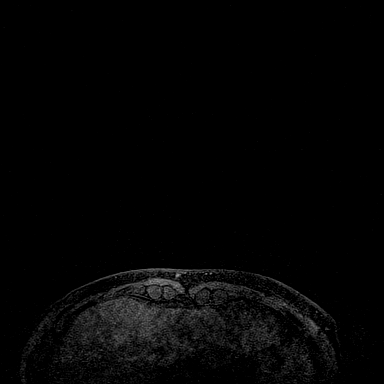
[im 36/144]
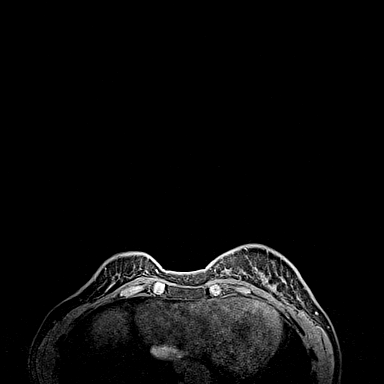
[im 72/144]
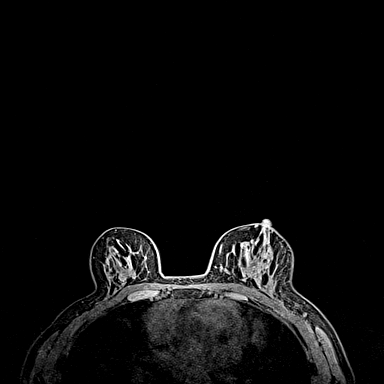
[im 108/144]
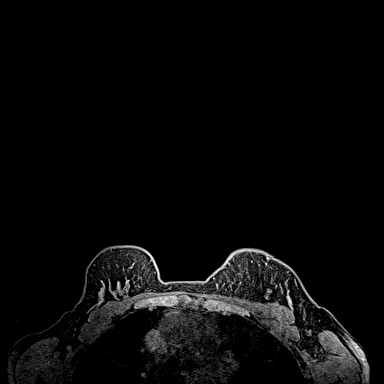
[im 144/144]
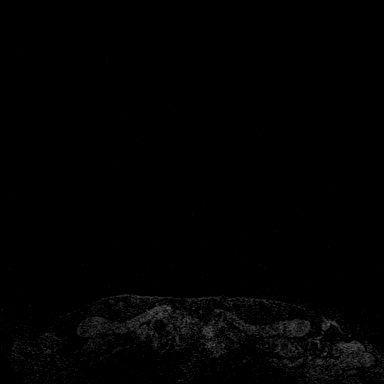

[Series 7: fl3d post immediate · axial · 1.2mm · 0.89mm/px · z∈[-79,+93]mm · 5 of 144 slices shown (1 of 3)]
[im 1/144]
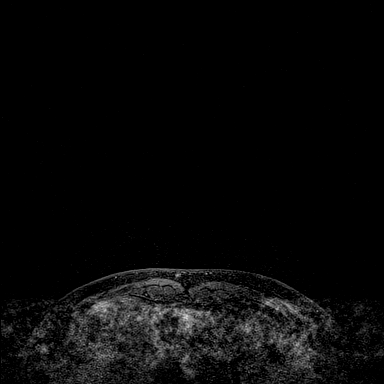
[im 36/144]
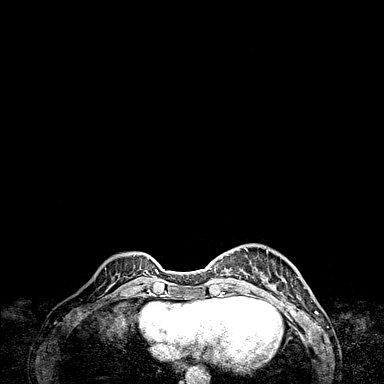
[im 72/144]
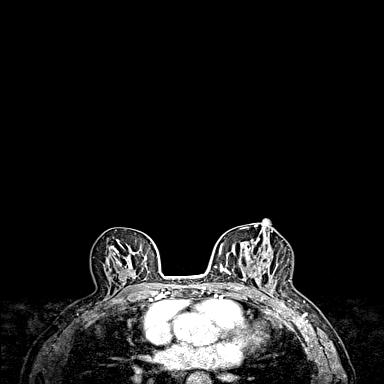
[im 108/144]
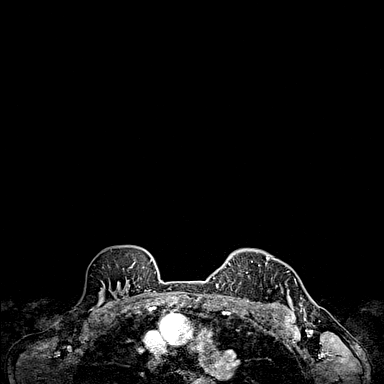
[im 144/144]
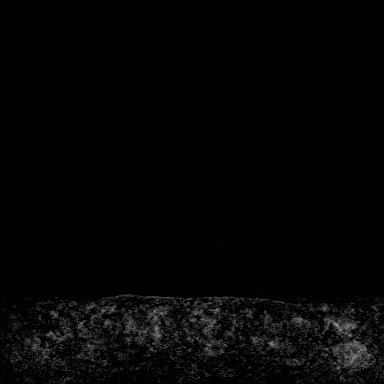

[Series 8: fl3d post immediate · axial · 1.2mm · 0.89mm/px · z∈[-79,+93]mm · 5 of 144 slices shown (2 of 3)]
[im 1/144]
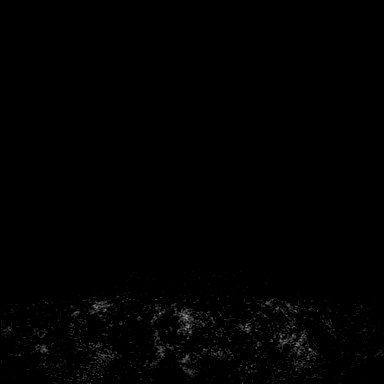
[im 36/144]
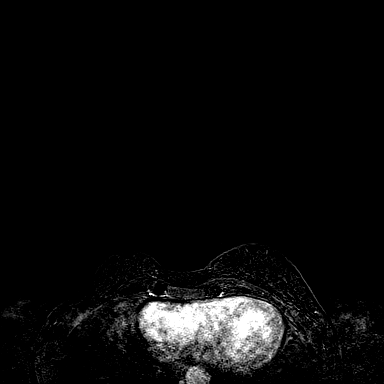
[im 72/144]
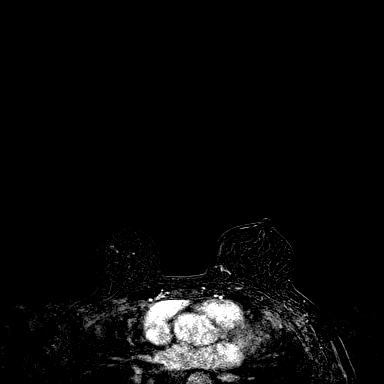
[im 108/144]
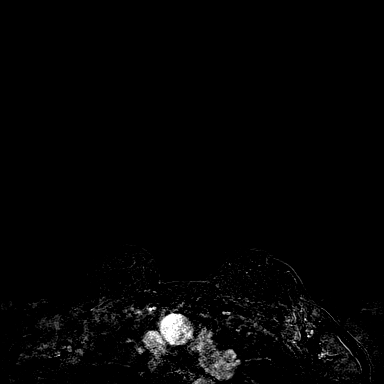
[im 144/144]
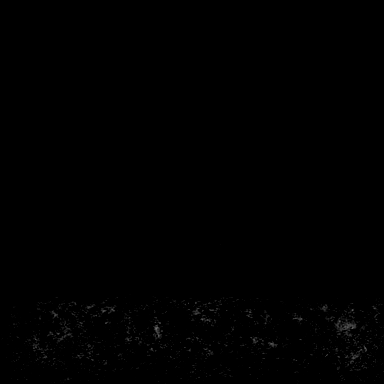

[Series 9: fl3d post immediate · axial · 172.8mm · 0.89mm/px · 1 of 1 slices shown (3 of 3)]
[im 1/1]
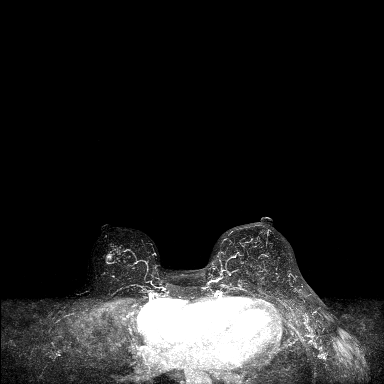

[Series 10: fl3d post 3min · axial · 1.2mm · 0.89mm/px · z∈[-79,+93]mm · 6 of 144 slices shown]
[im 1/144]
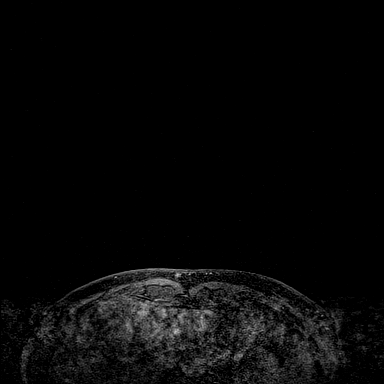
[im 29/144]
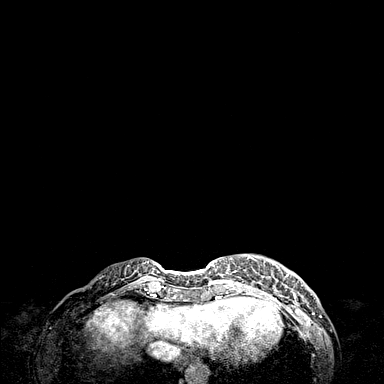
[im 58/144]
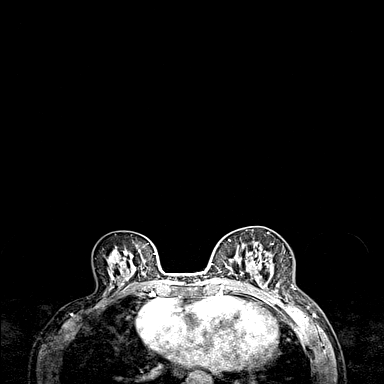
[im 86/144]
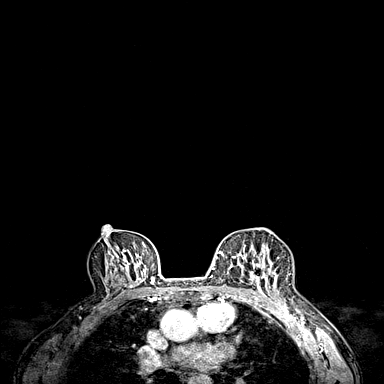
[im 115/144]
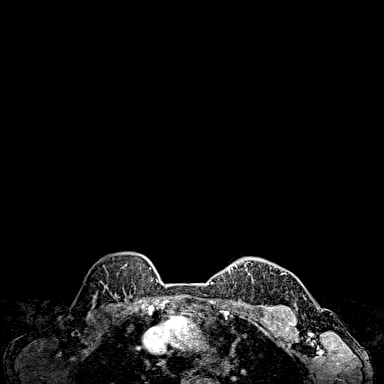
[im 144/144]
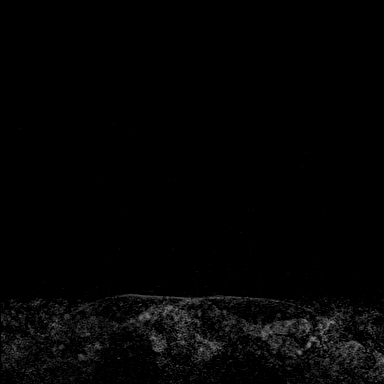

[Series 11: fl3d post 3min_sub · axial · 1.2mm · 0.89mm/px · z∈[-79,+58]mm · 5 of 144 slices shown]
[im 1/144]
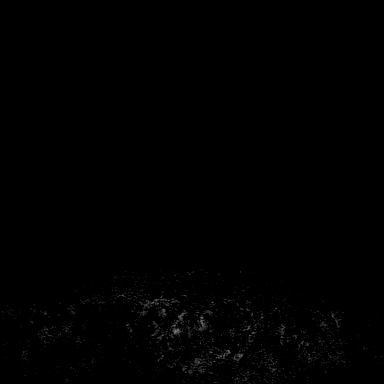
[im 29/144]
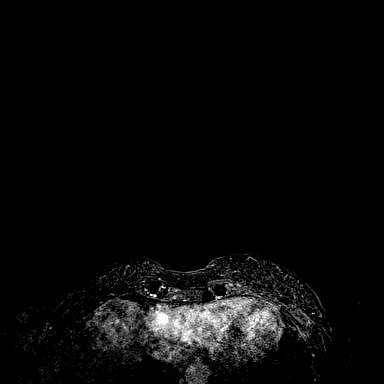
[im 58/144]
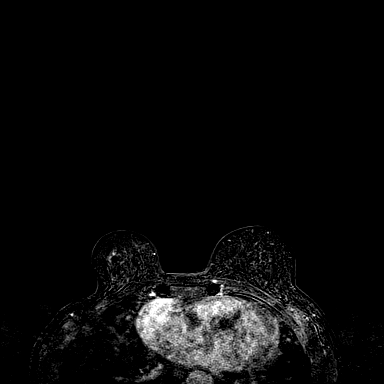
[im 86/144]
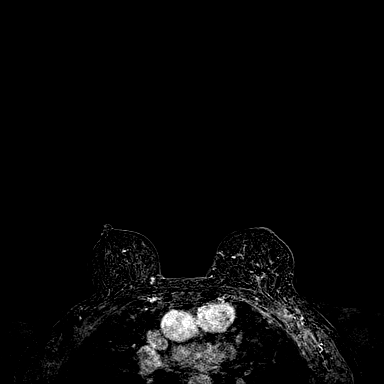
[im 115/144]
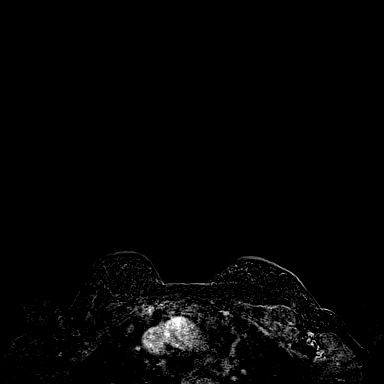

[33 of 48 positions shown; findings below may reference images not displayed]

Three-dimensional MR images were rendered by post-processing of the
original MR data on an independent workstation. The
three-dimensional MR images were interpreted, and findings are
reported in the following complete MRI report for this study. Three
dimensional images were evaluated at the independent Dyna[employer]
workstation
FINDINGS: Breast composition: c. Heterogeneous fibroglandular tissue.

Background parenchymal enhancement: Mild

Right breast: No mass or abnormal enhancement. Scattered cysts are
present.

Left breast: Postoperative changes are identified in the UPPER-OUTER
QUADRANT of the LEFT breast. Scattered cysts are present. No
suspicious enhancement.

Lymph nodes: No abnormal appearing lymph nodes.

Ancillary findings:  None.
IMPRESSION: No MRI evidence for malignancy.

RECOMMENDATION:
Bilateral diagnostic mammogram is recommended in June 2018.

Based on the recommendations of the American Cancer Society, annual
screening MRI is suggested in addition to annual mammography if the
patient has an estimated lifetime risk of developing breast cancer
which is greater than 20%.

BI-RADS CATEGORY  2: Benign.

## 2019-09-08 ENCOUNTER — Telehealth: Payer: Self-pay | Admitting: Oncology

## 2019-09-08 NOTE — Telephone Encounter (Signed)
Called pt back per 5/18 sch message - unable to reach pt . Left message for patient to call back for appt.

## 2019-09-16 ENCOUNTER — Other Ambulatory Visit (HOSPITAL_COMMUNITY): Payer: 59

## 2019-10-29 NOTE — Progress Notes (Signed)
[facility]  [phone] [phone]     ID: Rebekah Green DOB: [birth year]  MR#: [id number]  JQZ#:[id number]  Patient Care Team: Celene Squibb, MD as PCP - General (Internal Medicine) [name], MD as Consulting Physician (General Surgery) Magrinat, Virgie Dad, MD as Consulting Physician (Oncology) Eppie Gibson, MD as Attending Physician (Radiation Oncology) Allyn Kenner, MD as Referring Physician (Dermatology) Key, Nelia Shi, NP as Nurse Practitioner (Gynecology) Delice Bison, Charlestine Massed, NP as Nurse Practitioner (Hematology and Oncology) Danie Binder, MD (Inactive) as Consulting Physician (Gastroenterology) OTHER MD:  CHIEF COMPLAINT: 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  Since her last visit, she underwent bilateral diagnostic mammography with tomography and left breast ultrasonography at [facility] on 08/21/2019 showing: breast density category C; 0.8 cm complicated cyst in left breast at 3 o'clock, in same location as previously aspirated benign cyst (in 10/2017, benign); no evidence of right breast malignancy.  She also underwent excision of the chest lesion (photo in physical exam) on 03/13/2019 under Dr. Barry Dienes. Pathology from the procedure ([id number]) showed hypertrophic scar.  Her repeat breast MRI has been ordered for late October of this year but has not yet been scheduled.   REVIEW OF SYSTEMS: Rebekah Green is exercising chiefly by doing yoga.  They are also doing quite a bit of remodeling right now.  They do walk their dogs as well.  She has received both her COVID-19 vaccine doses, without complications.  Aside from these issues the detailed review of systems today was stable.   HISTORY OF CURRENT ILLNESS: From the original intake  note:  "Rebekah Green" had routine screening mammography on 06/28/2017 showing no evidence of malignancy in either breast. However, she subsequently developed a palpable abnormality in the left breast. She underwent unilateral left diagnostic mammography with tomography and left breast ultrasonography at [facility] on 10/09/2017 showing: breast density category C. At the 3 o'clock position upper outer quadrant of the left breast, there is an oval partially obscured mass with grouped calcification measuring 0.4 x 0.3 cm. By ultrasound, there was a 2.1 cm mass mass consistent with benign cyst and was hypoechoic with anechoic portions. The right axilla was negative for lymphadenopathy. Unilateral right diagnostic mammography showed no evidence of malignancy in the right breast.   Accordingly on 10/09/2017 she proceeded to biopsy of the left breast area in question. The pathology from this procedure showed ([id number]): Ductal carcinoma in situ, intermediate grade, with calcifications. Prognostic indicators significant for: both estrogen and progesterone receptor, 0% negative.  The cyst was drained with no residual postprocedure.   The patient's subsequent history is as detailed below.   PAST MEDICAL HISTORY: Past Medical History:  Diagnosis Date  . Cancer ([place]) 09/2017   DCIS left breast  . Family history of breast cancer   . History of radiation therapy 12/11/17- 01/08/18   Left Breast 15 fractions for a total dose of 40.05 Gy. Left breast boost 5 fractions for a total dose of 10 Gy.   . Personal history of radiation therapy      PAST SURGICAL HISTORY: Past Surgical History:  Procedure Laterality Date  . BREAST CYST ASPIRATION    . BREAST EXCISIONAL BIOPSY     unsure of breast, no scar  . BREAST LUMPECTOMY Left 2019  . BREAST LUMPECTOMY WITH RADIOACTIVE SEED LOCALIZATION  Left 10/23/2017   Procedure: BREAST LUMPECTOMY WITH RADIOACTIVE SEED LOCALIZATION;  Surgeon: [name], MD;  Location:  [facility];  Service: General;  Laterality: Left;  Rebekah Green Kitchen MASS EXCISION  03/30/2011   Procedure: EXCISION MASS;  Surgeon: Donato Heinz;  Location: [facility];  Service: General;  Laterality: N/A;  Excision Soft Tissue Mass Abdominal Wall    FAMILY HISTORY Family History  Problem Relation Age of Onset  . Breast cancer Mother 18  . Non-Hodgkin's lymphoma Brother 62       B cell  . Breast cancer Cousin 86  . Breast cancer Other        dx late 20's/ 30's.   . Anesthesia problems Neg Hx   . Hypotension Neg Hx   . Malignant hyperthermia Neg Hx   . Pseudochol deficiency Neg Hx   As of June 2019, the patient's father is alive at age 73. The patient's mother is alive at age 93, with a history of breast cancer diagnosed at age 19. The patient has 1 brother and 2 sisters. The patient's brother was diagnosed with non- Hodgkin's lymphoma remotely. There was a 1st cousin with breast cancer diagnosed in the late 50's. The patient's grandmother's sister also had breast cancer. The patient denies a family history of ovarian cancer.    GYNECOLOGIC HISTORY:  Patient's last menstrual period was 08/20/2014. 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  Age at first live birth: 59 years old She is GXP1. Her LMP was April 2017. She used oral contraceptive for 5 years with no complications. She never used HRT.   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 The patient is married to Rebekah Green, who is an EHS (environmental health and safety) specialist. Their son, Rebekah Green Kitchen "Rebekah Green" is 20 lives in [place] and works as a Government social research officer for [employer], IT trainer stations.     ADVANCED DIRECTIVES: In the absence of any documentation to the contrary, the patient's spouse is their HCPOA.    HEALTH MAINTENANCE: Social History   Tobacco Use  . Smoking status: Never Smoker  . Smokeless tobacco: Never Used  Substance Use Topics  .  Alcohol use: Yes    Comment: rare  . Drug use: No     Colonoscopy: 2010  PAP: 08/2017  Bone density: 2008 - scheduled 10/18/2017   No Known Allergies  Current Outpatient Medications  Medication Sig Dispense Refill  . Ascorbic Acid (VITAMIN C) 1000 MG tablet Take 1,000 mg by mouth daily.    . Cholecalciferol (VITAMIN D) 2000 UNITS tablet Take 2,000 Units by mouth every morning.      . Multiple Vitamins-Minerals (MULTIVITAMINS THER. W/MINERALS) TABS Take 1 tablet by mouth every morning.       No current facility-administered medications for this visit.    OBJECTIVE: White woman who appears younger than stated age 59:   10/30/19 0955  BP: 121/70  Pulse: (!) 58  Resp: 18  Temp: 98.3 F (36.8 C)  SpO2: 99%     Body mass index is 22.66 kg/m.   Wt Readings from Last 3 Encounters:  10/30/19 138 lb 4.8 oz (62.7 kg)  02/26/19 141 lb 1.6 oz (64 kg)  02/14/18 140 lb 2 oz (63.6 kg)      ECOG FS:0 - Asymptomatic  Sclerae unicteric, EOMs intact Wearing a mask No cervical or supraclavicular adenopathy Lungs no rales or rhonchi Heart regular rate and rhythm Abd  soft, nontender, positive bowel sounds MSK no focal spinal tenderness, no upper extremity lymphedema Neuro: nonfocal, well oriented, appropriate affect Breasts: The right breast is benign.  The left breast is status post lumpectomy with no evidence of recurrence.  Both axillae are benign.  LAB RESULTS:  CMP     Component Value Date/Time   NA 141 10/16/2017 1220   K 3.9 10/16/2017 1220   CL 103 10/16/2017 1220   CO2 30 10/16/2017 1220   GLUCOSE 103 (H) 10/16/2017 1220   BUN 14 10/16/2017 1220   CREATININE 0.89 10/16/2017 1220   CALCIUM 9.5 10/16/2017 1220   PROT 7.3 10/16/2017 1220   ALBUMIN 4.5 10/16/2017 1220   AST 18 10/16/2017 1220   ALT 14 10/16/2017 1220   ALKPHOS 89 10/16/2017 1220   BILITOT 0.4 10/16/2017 1220   GFRNONAA >60 10/16/2017 1220   GFRAA >60 10/16/2017 1220    No results found for:  TOTALPROTELP, ALBUMINELP, A1GS, A2GS, BETS, BETA2SER, GAMS, MSPIKE, SPEI  No results found for: KPAFRELGTCHN, LAMBDASER, KAPLAMBRATIO  Lab Results  Component Value Date   WBC 6.2 10/16/2017   NEUTROABS 3.4 10/16/2017   HGB 14.7 10/16/2017   HCT 43.9 10/16/2017   MCV 90.7 10/16/2017   PLT 217 10/16/2017   No results found for: LABCA2  No components found for: KGYJEH631  No results for input(s): INR in the last 168 hours.  No results found for: LABCA2  No results found for: SHF026  No results found for: VZC588  No results found for: FOY774  No results found for: CA2729  No components found for: HGQUANT  No results found for: CEA1 / No results found for: CEA1   No results found for: AFPTUMOR  No results found for: CHROMOGRNA  No results found for: HGBA, HGBA2QUANT, HGBFQUANT, HGBSQUAN (Hemoglobinopathy evaluation)   No results found for: LDH  No results found for: IRON, TIBC, IRONPCTSAT (Iron and TIBC)  No results found for: FERRITIN  Urinalysis No results found for: COLORURINE, APPEARANCEUR, LABSPEC, PHURINE, GLUCOSEU, HGBUR, BILIRUBINUR, KETONESUR, PROTEINUR, UROBILINOGEN, NITRITE, LEUKOCYTESUR   STUDIES: No results found.    ELIGIBLE FOR AVAILABLE RESEARCH PROTOCOL: not interested in COMET  ASSESSMENT: 59 y.o. Rebekah Green, [place] woman status post left breast upper outer quadrant biopsy 10/09/2017 for ductal carcinoma in situ, grade 2, estrogen and progesterone receptor negative.  (1) left lumpectomy 10/23/2017 showed only the healing biopsy site, no residual malignancy  (2) adjuvant radiation (12/11/2017-01/08/2018) Site/dose:   1. Left breast, 2.67 Gy x 15 fractions for a total dose of 40.05 Gy                      2. Left breast boost, 2 Gy x  5 fractions for a total dose of 10 Gy  (3) genetics testing 10/18/2017 offered through [facility] Common Hereditary Cancer Panel showed no deleterious mutations in: APC, ATM, AXIN2, BARD1, BMPR1A, BRCA1, BRCA2, BRIP1,  CDH1, CDK4, CDKN2A (p14ARF), CDKN2A (p16INK4a), CHEK2, CTNNA1, DICER1, EPCAM*, GREM1*, KIT, MEN1, MLH1, MSH2, MSH3, MSH6, MUTYH, NBN, NF1, PALB2, PDGFRA, PMS2, POLD1,POLE, PTEN, RAD50, RAD51C, RAD51D, SDHB, SDHC, SDHD, SMAD4, SMARCA4, STK11, TP53, TSC1, TSC2, VHL The following genes were evaluated for sequence changes only: HOXB13*, NTHL1*, SDHA  (4) breast cancer high risk (24% lifetime risk of developing another breast cancer)  (a) considered prophylactic antiestrogens: opted against  (b) opted for intensified screening   (i) MRI yearly (October/November)   (ii) mammography with tomography yearly (May)   (iii) bi-annual breast exam   PLAN:  Rebekah Green is now 2 years out from definitive surgery for her breast cancer with no evidence of disease activity.  This is very favorable.  Unfortunately we have not been able to get her MRI approved and today we discussed possibly switching to "fast" MRI.  She understands that the data there is very promising but preliminary.  She really would prefer to continue with the "standard" MRI and also with diagnostic mammography.  If she wishes to change her mind we can make some other arrangements but right now the plan is to continue with yearly breast MRI in November or late October and yearly diagnostic mammography in May.  She will see me again a year from now  She knows to call for any other issue that may develop before then  Total encounter time 25 minutes.*   Magrinat, Virgie Dad, MD  10/30/19 10:20 AM Medical Oncology and Hematology [facility] [place], [place] [address] Tel. [phone]    Fax. [phone]   I, Wilburn Mylar, am acting as scribe for Dr. Virgie Dad. Magrinat.  I, Lurline Del MD, have reviewed the above documentation for accuracy and completeness, and I agree with the above.   *Total Encounter Time as defined by the Centers for Medicare and Medicaid Services includes, in addition to the  face-to-face time of a patient visit (documented in the note above) non-face-to-face time: obtaining and reviewing outside history, ordering and reviewing medications, tests or procedures, care coordination (communications with other health care professionals or caregivers) and documentation in the medical record.

## 2019-10-30 ENCOUNTER — Inpatient Hospital Stay: Payer: 59 | Attending: Oncology | Admitting: Oncology

## 2019-10-30 ENCOUNTER — Other Ambulatory Visit: Payer: Self-pay

## 2019-10-30 VITALS — BP 121/70 | HR 58 | Temp 98.3°F | Resp 18 | Ht 65.5 in | Wt 138.3 lb

## 2019-10-30 DIAGNOSIS — Z171 Estrogen receptor negative status [ER-]: Secondary | ICD-10-CM | POA: Diagnosis not present

## 2019-10-30 DIAGNOSIS — Z923 Personal history of irradiation: Secondary | ICD-10-CM | POA: Diagnosis not present

## 2019-10-30 DIAGNOSIS — D0512 Intraductal carcinoma in situ of left breast: Secondary | ICD-10-CM | POA: Diagnosis present

## 2019-10-30 DIAGNOSIS — Z803 Family history of malignant neoplasm of breast: Secondary | ICD-10-CM | POA: Insufficient documentation

## 2019-11-02 ENCOUNTER — Telehealth: Payer: Self-pay | Admitting: Oncology

## 2019-11-02 NOTE — Telephone Encounter (Signed)
Scheduled appts per 7/9 los. Left voicemail with appt date and time.

## 2019-12-14 ENCOUNTER — Telehealth: Payer: Self-pay | Admitting: *Deleted

## 2019-12-14 NOTE — Telephone Encounter (Signed)
This RN returned the patient VM ( she left 2 last week ) stating she received a call from this office and was returning the call.  This RN has no data stating a nurse or MD attempted to call the patient. No documentation since last visit and noted VM left on 10/30/2019. No new data in chart showing reason that this office contacted the patient.  Obtained the pt's identified VM.  Message left per above as well as this RN's name for return call if needed.

## 2020-02-19 ENCOUNTER — Ambulatory Visit
Admission: RE | Admit: 2020-02-19 | Discharge: 2020-02-19 | Disposition: A | Discharge status: Home / Self Care | Payer: 59 | Source: Ambulatory Visit | Attending: Oncology | Admitting: Oncology

## 2020-02-19 DIAGNOSIS — D0512 Intraductal carcinoma in situ of left breast: Secondary | ICD-10-CM

## 2020-02-19 MED ORDER — GADOBUTROL 1 MMOL/ML IV SOLN
5.0000 mL | Freq: Once | INTRAVENOUS | Status: AC | PRN
  Administered 2020-02-19: 5 mL via INTRAVENOUS

## 2020-08-26 ENCOUNTER — Other Ambulatory Visit: Payer: Self-pay

## 2020-08-26 ENCOUNTER — Other Ambulatory Visit: Payer: Self-pay | Admitting: Oncology

## 2020-08-26 ENCOUNTER — Ambulatory Visit
Admission: RE | Admit: 2020-08-26 | Discharge: 2020-08-26 | Disposition: A | Discharge status: Home / Self Care | Payer: 59 | Source: Ambulatory Visit | Attending: Oncology | Admitting: Oncology

## 2020-08-26 DIAGNOSIS — D0512 Intraductal carcinoma in situ of left breast: Secondary | ICD-10-CM

## 2020-08-28 ENCOUNTER — Other Ambulatory Visit: Payer: Self-pay | Admitting: Oncology

## 2020-08-29 ENCOUNTER — Telehealth: Payer: Self-pay | Admitting: Oncology

## 2020-08-29 NOTE — Telephone Encounter (Signed)
Rescheduled appointment per 05/08 schedule message. Patient is aware. 

## 2020-11-14 ENCOUNTER — Ambulatory Visit: Payer: 59 | Admitting: Oncology

## 2020-11-21 ENCOUNTER — Ambulatory Visit: Payer: 59 | Admitting: Oncology

## 2020-12-12 ENCOUNTER — Other Ambulatory Visit: Payer: Self-pay | Admitting: Internal Medicine

## 2020-12-12 DIAGNOSIS — M810 Age-related osteoporosis without current pathological fracture: Secondary | ICD-10-CM

## 2020-12-13 ENCOUNTER — Inpatient Hospital Stay: Payer: 59 | Attending: Oncology | Admitting: Oncology

## 2020-12-13 ENCOUNTER — Other Ambulatory Visit: Payer: Self-pay

## 2020-12-13 VITALS — BP 118/67 | HR 68 | Temp 98.1°F | Resp 16 | Ht 65.0 in | Wt 135.3 lb

## 2020-12-13 DIAGNOSIS — Z803 Family history of malignant neoplasm of breast: Secondary | ICD-10-CM | POA: Insufficient documentation

## 2020-12-13 DIAGNOSIS — Z853 Personal history of malignant neoplasm of breast: Secondary | ICD-10-CM | POA: Insufficient documentation

## 2020-12-13 DIAGNOSIS — D0512 Intraductal carcinoma in situ of left breast: Secondary | ICD-10-CM

## 2020-12-13 NOTE — Progress Notes (Signed)
[facility]  Telephone:(336) [phone] Fax:(336) [phone]     ID: Rebekah Green DOB: [birth year]  MR#: [id number]  WNI#:[id number]  Patient Care Team: Celene Squibb, MD as PCP - General (Internal Medicine) [name], MD as Consulting Physician (General Surgery) Eashan Schipani, Virgie Dad, MD as Consulting Physician (Oncology) Eppie Gibson, MD as Attending Physician (Radiation Oncology) Allyn Kenner, MD as Referring Physician (Dermatology) Key, Nelia Shi, NP as Nurse Practitioner (Gynecology) Rebekah Green, Rebekah Massed, NP as Nurse Practitioner (Hematology and Oncology) Danie Binder, MD (Inactive) as Consulting Physician (Gastroenterology) OTHER MD:  CHIEF COMPLAINT: 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  Breast MRI 02/19/2020 showed breast density category D.  Also stable 9 mm complex cyst in the outer left breast at 3:00 unchanged.  Bilateral diagnostic mammography and left breast ultrasonography 08/26/2020 found breast density category C.  This showed the same 9 mm cyst previously noted.  There was no evidence of malignancy.   REVIEW OF SYSTEMS: Rebekah Green is exercising chiefly by doing yoga.  When she tried to weights she has pain in the left axilla.  She does take walks with her husband and her dogs.  She is enjoying her job and does not intend to retire about her father who is 58 does have significant dementia and that is a concern.  A detailed review of systems was otherwise noncontributory   HISTORY OF CURRENT ILLNESS: From the original intake note:  "Rebekah Green" had routine screening mammography on 06/28/2017 showing no evidence of malignancy in either breast. However, she subsequently developed a palpable abnormality in the left breast. She underwent unilateral left diagnostic mammography with tomography and  left breast ultrasonography at [facility] on 10/09/2017 showing: breast density category C. At the 3 o'clock position upper outer quadrant of the left breast, there is an oval partially obscured mass with grouped calcification measuring 0.4 x 0.3 cm. By ultrasound, there was a 2.1 cm mass mass consistent with benign cyst and was hypoechoic with anechoic portions. The right axilla was negative for lymphadenopathy. Unilateral right diagnostic mammography showed no evidence of malignancy in the right breast.   Accordingly on 10/09/2017 she proceeded to biopsy of the left breast area in question. The pathology from this procedure showed ([id number]): Ductal carcinoma in situ, intermediate grade, with calcifications. Prognostic indicators significant for: both estrogen and progesterone receptor, 0% negative.  The cyst was drained with no residual postprocedure.   The patient's subsequent history is as detailed below.   PAST MEDICAL HISTORY: Past Medical History:  Diagnosis Date   Cancer ([place]) 09/2017   DCIS left breast   Family history of breast cancer    History of radiation therapy 12/11/17- 01/08/18   Left Breast 15 fractions for a total dose of 40.05 Gy. Left breast boost 5 fractions for a total dose of 10 Gy.    Personal history of radiation therapy      PAST SURGICAL HISTORY: Past Surgical History:  Procedure Laterality Date   BREAST CYST ASPIRATION     BREAST EXCISIONAL BIOPSY     unsure of breast, no scar   BREAST LUMPECTOMY Left 2019   BREAST LUMPECTOMY WITH RADIOACTIVE SEED LOCALIZATION Left 10/23/2017   Procedure: BREAST LUMPECTOMY WITH RADIOACTIVE SEED LOCALIZATION;  Surgeon: [name], MD;  Location: [facility];  Service: General;  Laterality: Left;   MASS EXCISION  03/30/2011   Procedure: EXCISION MASS;  Surgeon: Donato Heinz;  Location: [facility];  Service: General;  Laterality: N/A;  Excision Soft Tissue Mass Abdominal Wall    FAMILY HISTORY Family  History  Problem Relation Age of Onset   Breast cancer Mother 43   Non-Hodgkin's lymphoma Brother 23       B cell   Breast cancer Cousin 57   Breast cancer Other        dx late 20's/ 74's.    Anesthesia problems Neg Hx    Hypotension Neg Hx    Malignant hyperthermia Neg Hx    Pseudochol deficiency Neg Hx   As of June 2019, the patient's father is alive at age 37. The patient's mother is alive at age 45, with a history of breast cancer diagnosed at age 64. The patient has 1 brother and 2 sisters. The patient's brother was diagnosed with non- Hodgkin's lymphoma remotely. There was a 1st cousin with breast cancer diagnosed in the late 50's. The patient's grandmother's sister also had breast cancer. The patient denies a family history of ovarian cancer.    GYNECOLOGIC HISTORY:  Patient's last menstrual period was 08/20/2014. 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  Age at first live birth: 60 years old She is GXP1. Her LMP was April 2017. She used oral contraceptive for 5 years with no complications. She never used HRT.   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 The patient is married to Rebekah Green, who is an EHS (environmental health and safety) specialist.  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  Her son, Rebekah Kitchen "[name]" is 61 lives in [place] and works as a Government social research officer for [employer]; he and his wife and 47-year-old son are planning to move to [facility] fall 2022    ADVANCED DIRECTIVES: In the absence of any documentation to the contrary, the patient's spouse is their [place].    HEALTH MAINTENANCE: Social History   Tobacco Use   Smoking status: Never   Smokeless tobacco: Never  Substance Use Topics   Alcohol use: Yes    Comment: rare   Drug use: No    No Known Allergies  Current Outpatient Medications  Medication Sig Dispense Refill   Ascorbic Acid (VITAMIN C) 1000 MG tablet Take 1,000 mg  by mouth daily.     Cholecalciferol (VITAMIN D) 2000 UNITS tablet Take 2,000 Units by mouth every morning.       Multiple Vitamins-Minerals (MULTIVITAMINS THER. W/MINERALS) TABS Take 1 tablet by mouth every morning.       No current facility-administered medications for this visit.    OBJECTIVE: White woman who appears younger than stated age 52:   12/13/20 1324  BP: 118/67  Pulse: 68  Resp: 16  Temp: 98.1 F (36.7 C)  SpO2: 99%     Body mass index is 22.52 kg/m.   Wt Readings from Last 3 Encounters:  12/13/20 135 lb 4.8 oz (61.4 kg)  10/30/19 138 lb 4.8 oz (62.7 kg)  02/26/19 141 lb 1.6 oz (64 kg)      ECOG FS:0 - Asymptomatic  Sclerae unicteric, EOMs intact Wearing a mask No cervical or supraclavicular adenopathy Lungs no rales or rhonchi Heart regular rate and rhythm Abd soft, nontender, positive bowel sounds MSK no focal spinal tenderness, no upper extremity lymphedema Neuro: nonfocal, well oriented, appropriate affect Breasts: The right breast is benign.  The left breast  is status post lumpectomy with no evidence of recurrence.  Both axillae are benign.  LAB RESULTS:  CMP     Component Value Date/Time   NA 141 10/16/2017 1220   K 3.9 10/16/2017 1220   CL 103 10/16/2017 1220   CO2 30 10/16/2017 1220   GLUCOSE 103 (H) 10/16/2017 1220   BUN 14 10/16/2017 1220   CREATININE 0.89 10/16/2017 1220   CALCIUM 9.5 10/16/2017 1220   PROT 7.3 10/16/2017 1220   ALBUMIN 4.5 10/16/2017 1220   AST 18 10/16/2017 1220   ALT 14 10/16/2017 1220   ALKPHOS 89 10/16/2017 1220   BILITOT 0.4 10/16/2017 1220   GFRNONAA >60 10/16/2017 1220   GFRAA >60 10/16/2017 1220    No results found for: TOTALPROTELP, ALBUMINELP, A1GS, A2GS, BETS, BETA2SER, GAMS, MSPIKE, SPEI  No results found for: KPAFRELGTCHN, LAMBDASER, KAPLAMBRATIO  Lab Results  Component Value Date   WBC 6.2 10/16/2017   NEUTROABS 3.4 10/16/2017   HGB 14.7 10/16/2017   HCT 43.9 10/16/2017   MCV 90.7  10/16/2017   PLT 217 10/16/2017   No results found for: LABCA2  No components found for: OMVEHM094  No results for input(s): INR in the last 168 hours.  No results found for: LABCA2  No results found for: BSJ628  No results found for: ZMO294  No results found for: TML465  No results found for: CA2729  No components found for: HGQUANT  No results found for: CEA1 / No results found for: CEA1   No results found for: AFPTUMOR  No results found for: CHROMOGRNA  No results found for: HGBA, HGBA2QUANT, HGBFQUANT, HGBSQUAN (Hemoglobinopathy evaluation)   No results found for: LDH  No results found for: IRON, TIBC, IRONPCTSAT (Iron and TIBC)  No results found for: FERRITIN  Urinalysis No results found for: COLORURINE, APPEARANCEUR, LABSPEC, PHURINE, GLUCOSEU, HGBUR, BILIRUBINUR, KETONESUR, PROTEINUR, UROBILINOGEN, NITRITE, LEUKOCYTESUR   STUDIES: No results found.    ELIGIBLE FOR AVAILABLE RESEARCH PROTOCOL: not interested in COMET  ASSESSMENT: 60 y.o. [place], [place] woman status post left breast upper outer quadrant biopsy 10/09/2017 for ductal carcinoma in situ, grade 2, estrogen and progesterone receptor negative.  (1) left lumpectomy 10/23/2017 showed only the healing biopsy site, no residual malignancy  (2) adjuvant radiation (12/11/2017-01/08/2018) Site/dose:   1. Left breast, 2.67 Gy x 15 fractions for a total dose of 40.05 Gy                      2. Left breast boost, 2 Gy x  5 fractions for a total dose of 10 Gy  (3) genetics testing 10/18/2017 offered through [facility] Common Hereditary Cancer Panel showed no deleterious mutations in: APC, ATM, AXIN2, BARD1, BMPR1A, BRCA1, BRCA2, BRIP1, CDH1, CDK4, CDKN2A (p14ARF), CDKN2A (p16INK4a), CHEK2, CTNNA1, DICER1, EPCAM*, GREM1*, KIT, MEN1, MLH1, MSH2, MSH3, MSH6, MUTYH, NBN, NF1, PALB2, PDGFRA, PMS2, POLD1,POLE, PTEN, RAD50, RAD51C, RAD51D, SDHB, SDHC, SDHD, SMAD4, SMARCA4, STK11, TP53, TSC1, TSC2, VHL The following  genes were evaluated for sequence changes only: HOXB13*, NTHL1*, SDHA  (4) breast cancer high risk (24% lifetime risk of developing another breast cancer)  (a) considered prophylactic antiestrogens: opted against  (b) opted for intensified screening   (i) MRI yearly (October/November)   (ii) mammography with tomography yearly (May)   (iii) bi-annual breast exam   PLAN: Betti is now 3 years out from definitive surgery for her breast cancer with no evidence of disease recurrence.  This is very favorable.  She gets her yearly breast  MRI in October.  We have discussed cost issues and the alternative of a "fast" MRI if her insurance does not pay for at least part of the standard breast MRI.  We have also discussed the difference between screening and diagnostic mammography, and given her breast density and high risk we have opted for diagnostic.  Those orders have been entered.  She will return to see [place] in 1 year.  She knows to call for any other issue that may develop before that visit.  Total encounter time 20 minutes.*   Zakeya Junker, Virgie Dad, MD  12/13/20 5:18 PM Medical Oncology and Hematology [facility] [place], [place] [address] Tel. [phone]    Fax. [phone]   I, Wilburn Mylar, am acting as scribe for Dr. Virgie Dad. Delecia Vastine.  I, Lurline Del MD, have reviewed the above documentation for accuracy and completeness, and I agree with the above.   *Total Encounter Time as defined by the Centers for Medicare and Medicaid Services includes, in addition to the face-to-face time of a patient visit (documented in the note above) non-face-to-face time: obtaining and reviewing outside history, ordering and reviewing medications, tests or procedures, care coordination (communications with other health care professionals or caregivers) and documentation in the medical record.

## 2021-02-22 ENCOUNTER — Other Ambulatory Visit: Payer: 59

## 2021-02-24 ENCOUNTER — Ambulatory Visit
Admission: RE | Admit: 2021-02-24 | Discharge: 2021-02-24 | Disposition: A | Discharge status: Home / Self Care | Payer: 59 | Source: Ambulatory Visit | Attending: Oncology | Admitting: Oncology

## 2021-02-24 ENCOUNTER — Other Ambulatory Visit: Payer: Self-pay

## 2021-02-24 DIAGNOSIS — D0512 Intraductal carcinoma in situ of left breast: Secondary | ICD-10-CM

## 2021-02-24 MED ORDER — GADOBUTROL 1 MMOL/ML IV SOLN
6.0000 mL | Freq: Once | INTRAVENOUS | Status: AC | PRN
  Administered 2021-02-24: 6 mL via INTRAVENOUS

## 2021-02-28 ENCOUNTER — Telehealth: Payer: Self-pay

## 2021-02-28 NOTE — Telephone Encounter (Signed)
Called pt to report no breast cancer on MRI per Mendel Ryder.  Pt verbalized thanks

## 2021-06-02 ENCOUNTER — Other Ambulatory Visit: Payer: Self-pay

## 2021-06-02 ENCOUNTER — Ambulatory Visit
Admission: RE | Admit: 2021-06-02 | Discharge: 2021-06-02 | Disposition: A | Discharge status: Home / Self Care | Payer: 59 | Source: Ambulatory Visit | Attending: Internal Medicine | Admitting: Internal Medicine

## 2021-06-02 DIAGNOSIS — M810 Age-related osteoporosis without current pathological fracture: Secondary | ICD-10-CM

## 2021-06-14 ENCOUNTER — Other Ambulatory Visit: Payer: Self-pay | Admitting: Hematology and Oncology

## 2021-06-14 ENCOUNTER — Other Ambulatory Visit: Payer: Self-pay | Admitting: Oncology

## 2021-06-14 DIAGNOSIS — D0512 Intraductal carcinoma in situ of left breast: Secondary | ICD-10-CM

## 2021-06-29 ENCOUNTER — Other Ambulatory Visit: Payer: Self-pay | Admitting: General Surgery

## 2021-08-11 ENCOUNTER — Other Ambulatory Visit: Payer: Self-pay | Admitting: General Surgery

## 2021-09-01 ENCOUNTER — Ambulatory Visit
Admission: RE | Admit: 2021-09-01 | Discharge: 2021-09-01 | Disposition: A | Discharge status: Home / Self Care | Payer: 59 | Source: Ambulatory Visit | Attending: Hematology and Oncology | Admitting: Hematology and Oncology

## 2021-09-01 DIAGNOSIS — D0512 Intraductal carcinoma in situ of left breast: Secondary | ICD-10-CM

## 2021-11-09 ENCOUNTER — Telehealth: Payer: Self-pay | Admitting: Hematology and Oncology

## 2021-11-09 NOTE — Telephone Encounter (Signed)
.  Called patient to schedule appointment per 7/19 inbasket, patient is aware of date and time.   

## 2021-11-15 ENCOUNTER — Telehealth: Payer: Self-pay | Admitting: Hematology and Oncology

## 2021-11-15 NOTE — Telephone Encounter (Signed)
Rescheduled appointment per provider PAL. Left voicemail. 

## 2021-11-22 ENCOUNTER — Telehealth: Payer: Self-pay | Admitting: Hematology and Oncology

## 2021-11-22 NOTE — Telephone Encounter (Signed)
Rescheduled appointment per provider PAL. Patient is aware of the changes made to her upcoming appointment. 

## 2021-11-24 ENCOUNTER — Other Ambulatory Visit: Payer: Self-pay

## 2021-11-24 ENCOUNTER — Ambulatory Visit
Admission: RE | Admit: 2021-11-24 | Discharge: 2021-11-24 | Disposition: A | Discharge status: Home / Self Care | Payer: 59 | Source: Ambulatory Visit | Attending: Nurse Practitioner | Admitting: Nurse Practitioner

## 2021-11-24 VITALS — BP 153/83 | HR 83 | Temp 99.6°F | Resp 20

## 2021-11-24 DIAGNOSIS — J029 Acute pharyngitis, unspecified: Secondary | ICD-10-CM | POA: Diagnosis present

## 2021-11-24 LAB — POCT RAPID STREP A (OFFICE): Rapid Strep A Screen: NEGATIVE

## 2021-11-24 NOTE — Discharge Instructions (Signed)
-   Rapid strep throat test is negative today; we will call you if the culture comes back positive on Monday - If the culture is negative, this means your sore throat is likely viral and will get better on its own after a couple of days  - Continue salt water gargles, warm liquids with lemon/honey

## 2021-11-24 NOTE — ED Provider Notes (Signed)
[facility]    CSN: [id number] Arrival date & time: 11/24/21  1519      History   Chief Complaint Chief Complaint  Patient presents with   Sore Throat    Entered by patient    HPI Rebekah Green is a 61 y.o. female.   Patient presents with 2 days of sore throat, postnasal drainage, slightly runny nose, and chills at home.  She denies known fevers, cough, congestion, shortness of breath/wheezing, sneezing, swollen glands, headache, ear pain, pressure, or drainage, eye redness or itching, abdominal pain, nausea/vomiting, diarrhea, decreased appetite, new rash, and fatigue.  Reports that she is keeping her 35-monthold grandson and wants to make sure she stays well enough to keep watching him.  She has been using salt water gargles which do help temporarily as well as Tylenol.  She reports her grandson is not currently sick, no other family members are sick.    Past Medical History:  Diagnosis Date   [place] 09[place]   DCIS left breast   Family history of breast cancer    History of radiation therapy 12/11/17- 01/08/18   Left Breast 15 fractions for a total dose of 40.05 Gy. Left breast boost 5 fractions for a total dose of 10 Gy.    Personal history of radiation therapy     Patient Active Problem List   Diagnosis Date Noted   Genetic testing 11/20/2017   Family history of breast cancer    Ductal carcinoma in situ (DCIS) of left breast 10/11/2017    Past Surgical History:  Procedure Laterality Date   BREAST CYST ASPIRATION     BREAST EXCISIONAL BIOPSY     unsure of breast, no scar   BREAST LUMPECTOMY Left 2019   BREAST LUMPECTOMY WITH RADIOACTIVE SEED LOCALIZATION Left 10/23/2017   Procedure: BREAST LUMPECTOMY WITH RADIOACTIVE SEED LOCALIZATION;  Surgeon: BStark Klein MD;  [facility]  Service: General;  Laterality: Left;   MASS EXCISION  03/30/2011   Procedure: EXCISION MASS;  Surgeon: BDonato Heinz  Location[facility];  Service:  General;  Laterality: N/A;  Excision Soft Tissue Mass Abdominal Wall    OB History   No obstetric history on file.      Home Medications    Prior to Admission medications   Medication Sig Start Date End Date Taking? Authorizing Provider  Ascorbic Acid (VITAMIN C) 1000 MG tablet Take 1,000 mg by mouth daily.    [provider]  Cholecalciferol (VITAMIN D) 2000 UNITS tablet Take 2,000 Units by mouth every morning.      [provider]  Multiple Vitamins-Minerals (MULTIVITAMINS THER. W/MINERALS) TABS Take 1 tablet by mouth every morning.      [provider]    Family History Family History  Problem Relation Age of Onset   Breast cancer Mother 780  Non-Hodgkin's lymphoma Brother 569      B cell   Breast cancer Cousin 594  Breast cancer Other        dx late 20's/ 30's.    Anesthesia problems Neg Hx    Hypotension Neg Hx    Malignant hyperthermia Neg Hx    Pseudochol deficiency Neg Hx     Social History Social History   Tobacco Use   Smoking status: Never   Smokeless tobacco: Never  Substance Use Topics   Alcohol use: Yes    Comment: rare   Drug use: No     Allergies  Patient has no known allergies.   Review of Systems Review of Systems Per HPI  Physical Exam Triage Vital Signs ED Triage Vitals  Enc Vitals Group     BP 11/24/21 1528 (!) 153/83     Pulse Rate 11/24/21 1528 83     Resp 11/24/21 1528 20     Temp 11/24/21 1528 99.6 F (37.6 C)     Temp Source 11/24/21 1528 Oral     SpO2 11/24/21 1528 97 %     Weight --      Height --      Head Circumference --      Peak Flow --      Pain Score 11/24/21 1529 4     Pain Loc --      Pain Edu? --      Excl. in [place]? --    No data found.  Updated Vital Signs BP (!) 153/83 (BP Location: Right Arm)   Pulse 83   Temp 99.6 F (37.6 C) (Oral)   Resp 20   LMP 08/20/2014   SpO2 97%   Visual Acuity Right Eye Distance:   Left Eye Distance:   Bilateral Distance:    Right Eye  Near:   Left Eye Near:    Bilateral Near:     Physical Exam Vitals and nursing note reviewed.  Constitutional:      General: She is not in acute distress.    Appearance: She is well-developed. She is not toxic-appearing.  HENT:     Head: Normocephalic and atraumatic.     Right Ear: Tympanic membrane and ear canal normal. No drainage, swelling or tenderness. No middle ear effusion. Tympanic membrane is not erythematous.     Left Ear: Tympanic membrane and ear canal normal. No drainage, swelling or tenderness.  No middle ear effusion. Tympanic membrane is not erythematous.     Nose: No congestion or rhinorrhea.     Mouth/Throat:     Mouth: Mucous membranes are moist.     Pharynx: Oropharynx is clear. Uvula midline. Posterior oropharyngeal erythema present. No oropharyngeal exudate.     Tonsils: No tonsillar exudate. 0 on the right. 0 on the left.  Eyes:     Extraocular Movements:     Right eye: Normal extraocular motion.     Left eye: Normal extraocular motion.  Cardiovascular:     Rate and Rhythm: Normal rate and regular rhythm.  Pulmonary:     Effort: Pulmonary effort is normal. No respiratory distress.     Breath sounds: Normal breath sounds. No wheezing, rhonchi or rales.  Musculoskeletal:     Cervical back: Normal range of motion and neck supple.  Lymphadenopathy:     Cervical: Cervical adenopathy present.  Skin:    General: Skin is warm and dry.     Capillary Refill: Capillary refill takes less than 2 seconds.     Coloration: Skin is not pale.     Findings: No erythema or rash.  Neurological:     Mental Status: She is alert and oriented to person, place, and time.  Psychiatric:        Behavior: Behavior is cooperative.      UC Treatments / Results  Labs (all labs ordered are listed, but only abnormal results are displayed) Labs Reviewed  CULTURE, GROUP A STREP [facility])  POCT RAPID STREP A (OFFICE)    EKG   Radiology No results found.  Procedures Procedures  (including critical care time)  Medications Ordered in UC  Medications - No data to display  Initial Impression / Assessment and Plan / UC Course  I have reviewed the triage vital signs and the nursing notes.  Pertinent labs & imaging results that were available during my care of the patient were reviewed by me and considered in my medical decision making (see chart for details).    Patient is a very pleasant, well-appearing 61 year old female presenting for acute pharyngitis today.  Rapid strep throat test is negative, throat culture pending.  Discussed that if negative, symptoms likely viral and will be self-limiting after couple of days.  Supportive care discussed.  Seek care if symptoms persist or worsen despite treatment.  The patient was given the opportunity to ask questions.  All questions answered to their satisfaction.  The patient is in agreement to this plan.   Final Clinical Impressions(s) / UC Diagnoses   Final diagnoses:  Acute pharyngitis, unspecified etiology     Discharge Instructions      - Rapid strep throat test is negative today; we will call you if the culture comes back positive on Monday - If the culture is negative, this means your sore throat is likely viral and will get better on its own after a couple of days  - Continue salt water gargles, warm liquids with lemon/honey    ED Prescriptions   None    PDMP not reviewed this encounter.   Eulogio Bear, NP 11/24/21 [phone]

## 2021-11-24 NOTE — ED Triage Notes (Signed)
Pt reports sore throat x2 days. Pt reports took tylenol for chills and pain but denies any known fevers.

## 2021-11-27 LAB — CULTURE, GROUP A STREP (THRC)

## 2021-12-05 ENCOUNTER — Telehealth: Payer: Self-pay | Admitting: Hematology and Oncology

## 2021-12-05 NOTE — Telephone Encounter (Signed)
Rescheduled appointment per provider PAL. Left voicemail. 

## 2021-12-15 ENCOUNTER — Ambulatory Visit: Payer: 59 | Admitting: Hematology and Oncology

## 2021-12-29 ENCOUNTER — Ambulatory Visit: Payer: 59 | Admitting: Hematology and Oncology

## 2022-01-02 ENCOUNTER — Ambulatory Visit: Payer: 59 | Admitting: Hematology and Oncology

## 2022-01-15 NOTE — Progress Notes (Signed)
Patient Care Team: Benita Stabile, MD as PCP - General (Internal Medicine) Almond Lint, MD as Consulting Physician (General Surgery) Magrinat, Valentino Hue, MD (Inactive) as Consulting Physician (Oncology) Lonie Peak, MD as Attending Physician (Radiation Oncology) Nita Sells, MD as Referring Physician (Dermatology) Key, Verita Schneiders, NP as Nurse Practitioner (Gynecology) Axel Filler, Larna Daughters, NP as Nurse Practitioner (Hematology and Oncology) West Bali, MD (Inactive) as Consulting Physician (Gastroenterology)  DIAGNOSIS:  Encounter Diagnosis  Name Primary?   Ductal carcinoma in situ (DCIS) of left breast Yes    SUMMARY OF ONCOLOGIC HISTORY: Oncology History  Ductal carcinoma in situ (DCIS) of left breast  10/09/2017 Initial Diagnosis   status post left breast upper outer quadrant biopsy for ductal carcinoma in situ, grade 2, estrogen and progesterone receptor negative.   10/18/2017 Genetic Testing   genetics testing offered through [facility] Common Hereditary Cancer Panel showed no deleterious mutations in: APC, ATM, AXIN2, BARD1, BMPR1A, BRCA1, BRCA2, BRIP1, CDH1, CDK4, CDKN2A (p14ARF), CDKN2A (p16INK4a), CHEK2, CTNNA1, DICER1, EPCAM*, GREM1*, KIT, MEN1, MLH1, MSH2, MSH3, MSH6, MUTYH, NBN, NF1, PALB2, PDGFRA, PMS2, POLD1,POLE, PTEN, RAD50, RAD51C, RAD51D, SDHB, SDHC, SDHD, SMAD4, SMARCA4, STK11, TP53, TSC1, TSC2, VHL The following genes were evaluated for sequence changes only: HOXB13*, NTHL1*, SDHA   10/23/2017 Surgery    left lumpectomy showed only the healing biopsy site, no residual malignancy   12/11/2017 - 01/08/2018 Radiation Therapy   Site/dose:   1. Left breast, 2.67 Gy x 15 fractions for a total dose of 40.05 Gy                      2. Left breast boost, 2 Gy x  5 fractions for a total dose of 10 Gy    Miscellaneous   breast cancer high risk (24% lifetime risk of developing another breast cancer)             (a) considered prophylactic antiestrogens: opted against              (b) opted for intensified screening                         (i) MRI yearly (December)                         (ii) mammography with tomography yearly (June)                         (iii) bi-annual breast exam     CHIEF COMPLIANT: Follow-up breast cancer surveillance Establish oncology care with Dr. Pamelia Hoit   INTERVAL HISTORY: Rebekah Green is a 61 Year-old-with the above mentioned. She presents to the clinic for a follow-up and Establish oncology care with Dr. Pamelia Hoit. She reports that she wanted to continue doing mri and mammogram surveillance. Her main concern was about her bone density. Overall she is doing well.      ALLERGIES:  has No Known Allergies.  MEDICATIONS:  Current Outpatient Medications  Medication Sig Dispense Refill   Ascorbic Acid (VITAMIN C) 1000 MG tablet Take 1,000 mg by mouth daily.     Cholecalciferol (VITAMIN D) 2000 UNITS tablet Take 2,000 Units by mouth every morning.       Multiple Vitamins-Minerals (MULTIVITAMINS THER. W/MINERALS) TABS Take 1 tablet by mouth every morning.       No current facility-administered medications for this visit.    PHYSICAL EXAMINATION: ECOG  PERFORMANCE STATUS: 1 - Symptomatic but completely ambulatory  Vitals:   01/19/22 0901  BP: 117/68  Pulse: 70  Resp: 13  Temp: (!) 97.5 F (36.4 C)  SpO2: 99%   Filed Weights   01/19/22 0901  Weight: 130 lb 8 oz (59.2 kg)    BREAST: No palpable masses or nodules in either right or left breasts. No palpable axillary supraclavicular or infraclavicular adenopathy no breast tenderness or nipple discharge. (exam performed in the presence of a chaperone)  LABORATORY DATA:  I have reviewed the data as listed    Latest Ref Rng & Units 10/16/2017   12:20 PM 03/23/2011   10:13 AM  CMP  Glucose 70 - 99 mg/dL 103  93   BUN 6 - 20 mg/dL 14  14   Creatinine 0.44 - 1.00 mg/dL 0.89  0.85   Sodium 135 - 145 mmol/L 141  136   Potassium 3.5 - 5.1 mmol/L 3.9  4.5   Chloride 98 -  111 mmol/L 103  102   CO2 22 - 32 mmol/L 30  28   Calcium 8.9 - 10.3 mg/dL 9.5  9.2   Total Protein 6.5 - 8.1 g/dL 7.3    Total Bilirubin 0.3 - 1.2 mg/dL 0.4    Alkaline Phos 38 - 126 U/L 89    AST 15 - 41 U/L 18    ALT 0 - 44 U/L 14      Lab Results  Component Value Date   WBC 6.2 10/16/2017   HGB 14.7 10/16/2017   HCT 43.9 10/16/2017   MCV 90.7 10/16/2017   PLT 217 10/16/2017   NEUTROABS 3.4 10/16/2017    ASSESSMENT & PLAN:  Ductal carcinoma in situ (DCIS) of left breast 10/09/2017 for ductal carcinoma in situ, grade 2, estrogen and progesterone receptor negative 10/23/2017 left lumpectomy; No residual cancer 12/11/2017-01/08/2018: Adj XRT 10/19/27: Genetics Neg  Decided not to take anti-estrogens  Breast Cancer Surveillance: 1. Breast Exam: 01/19/22: Benign 2. Mammograms: 09/01/21; Benign, density Cat C 3. Breast MRI: 02/27/21: Benign    Osteoporosis: T score -3.8: I recommended that she take bisphosphonate therapy but she has read about the side effects and was not too keen on it.  Therefore we will obtain another bone density in February 2024 to help make the decision for her.  RTC in 1 year    Orders Placed This Encounter  Procedures   MR BREAST BILATERAL W WO CONTRAST INC CAD    Standing Status:   Future    Standing Expiration Date:   01/20/2023    Order Specific Question:   If indicated for the ordered procedure, I authorize the administration of contrast media per Radiology protocol    Answer:   Yes    Order Specific Question:   What is the patient's sedation requirement?    Answer:   No Sedation    Order Specific Question:   Does the patient have a pacemaker or implanted devices?    Answer:   No    Order Specific Question:   Preferred imaging location?    Answer:   [facility] (table limit-550lbs)   DG Bone Density    Standing Status:   Future    Standing Expiration Date:   01/20/2023    Order Specific Question:   Reason for Exam (SYMPTOM  OR DIAGNOSIS  REQUIRED)    Answer:   post menopausal    Order Specific Question:   Preferred imaging location?    Answer:  [facility]   The patient has a good understanding of the overall plan. she agrees with it. she will call with any problems that may develop before the next visit here. Total time spent: 30 mins including face to face time and time spent for planning, charting and co-ordination of care   Harriette Ohara, MD 01/19/22    .ds

## 2022-01-19 ENCOUNTER — Other Ambulatory Visit: Payer: Self-pay

## 2022-01-19 ENCOUNTER — Inpatient Hospital Stay: Payer: 59 | Attending: Hematology and Oncology | Admitting: Hematology and Oncology

## 2022-01-19 VITALS — BP 117/68 | HR 70 | Temp 97.5°F | Resp 13 | Ht 64.0 in | Wt 130.5 lb

## 2022-01-19 DIAGNOSIS — D0512 Intraductal carcinoma in situ of left breast: Secondary | ICD-10-CM | POA: Diagnosis not present

## 2022-01-19 DIAGNOSIS — M810 Age-related osteoporosis without current pathological fracture: Secondary | ICD-10-CM | POA: Insufficient documentation

## 2022-01-19 DIAGNOSIS — Z86000 Personal history of in-situ neoplasm of breast: Secondary | ICD-10-CM | POA: Diagnosis present

## 2022-01-19 DIAGNOSIS — Z923 Personal history of irradiation: Secondary | ICD-10-CM | POA: Diagnosis not present

## 2022-01-19 NOTE — Assessment & Plan Note (Addendum)
10/09/2017 for ductal carcinoma in situ, grade 2, estrogen and progesterone receptor negative 10/23/2017 left lumpectomy; No residual cancer 12/11/2017-01/08/2018: Adj XRT 10/19/27: Genetics Neg  Decided not to take anti-estrogens  Breast Cancer Surveillance: 1. Breast Exam: 01/19/22: Benign 2. Mammograms: 09/01/21; Benign, density Cat C 3. Breast MRI: 02/27/21: Benign    Osteoporosis: T score -3.8: I recommended that she take bisphosphonate therapy but she has read about the side effects and was not too keen on it.  Therefore we will obtain another bone density in February 2024 to help make the decision for her.  RTC in 1 year

## 2022-02-23 ENCOUNTER — Other Ambulatory Visit: Payer: 59

## 2022-03-02 ENCOUNTER — Ambulatory Visit
Admission: RE | Admit: 2022-03-02 | Discharge: 2022-03-02 | Disposition: A | Discharge status: Home / Self Care | Payer: 59 | Source: Ambulatory Visit | Attending: Hematology and Oncology | Admitting: Hematology and Oncology

## 2022-03-02 ENCOUNTER — Telehealth: Payer: Self-pay | Admitting: *Deleted

## 2022-03-02 DIAGNOSIS — D0512 Intraductal carcinoma in situ of left breast: Secondary | ICD-10-CM

## 2022-03-02 MED ORDER — GADOPICLENOL 0.5 MMOL/ML IV SOLN
6.0000 mL | Freq: Once | INTRAVENOUS | Status: AC | PRN
  Administered 2022-03-02: 6 mL via INTRAVENOUS

## 2022-03-02 NOTE — Telephone Encounter (Signed)
Per MD request RN placed call to pt regarding recent Breast MRI being negative for metastatic disease.  Pt educated and verbalized understanding.

## 2022-04-08 ENCOUNTER — Encounter (HOSPITAL_COMMUNITY): Payer: Self-pay | Admitting: *Deleted

## 2022-04-08 ENCOUNTER — Other Ambulatory Visit: Payer: Self-pay

## 2022-04-08 ENCOUNTER — Emergency Department (HOSPITAL_COMMUNITY)
Admission: EM | Admit: 2022-04-08 | Discharge: 2022-04-08 | Disposition: A | Discharge status: Home / Self Care | Payer: 59 | Attending: Emergency Medicine | Admitting: Emergency Medicine

## 2022-04-08 DIAGNOSIS — E860 Dehydration: Secondary | ICD-10-CM | POA: Diagnosis not present

## 2022-04-08 DIAGNOSIS — Z853 Personal history of malignant neoplasm of breast: Secondary | ICD-10-CM | POA: Diagnosis not present

## 2022-04-08 DIAGNOSIS — R112 Nausea with vomiting, unspecified: Secondary | ICD-10-CM | POA: Diagnosis not present

## 2022-04-08 DIAGNOSIS — R519 Headache, unspecified: Secondary | ICD-10-CM | POA: Diagnosis present

## 2022-04-08 DIAGNOSIS — Z1152 Encounter for screening for COVID-19: Secondary | ICD-10-CM | POA: Insufficient documentation

## 2022-04-08 LAB — URINALYSIS, ROUTINE W REFLEX MICROSCOPIC
Bacteria, UA: NONE SEEN
Bilirubin Urine: NEGATIVE
Glucose, UA: NEGATIVE mg/dL
Hgb urine dipstick: NEGATIVE
Ketones, ur: 20 mg/dL — AB
Nitrite: NEGATIVE
Protein, ur: NEGATIVE mg/dL
Specific Gravity, Urine: 1.013 (ref 1.005–1.030)
pH: 5 (ref 5.0–8.0)

## 2022-04-08 LAB — CBC WITH DIFFERENTIAL/PLATELET
Abs Immature Granulocytes: 0.01 10*3/uL (ref 0.00–0.07)
Basophils Absolute: 0.1 10*3/uL (ref 0.0–0.1)
Basophils Relative: 1 %
Eosinophils Absolute: 0 10*3/uL (ref 0.0–0.5)
Eosinophils Relative: 0 %
HCT: 43.3 % (ref 36.0–46.0)
Hemoglobin: 14.5 g/dL (ref 12.0–15.0)
Immature Granulocytes: 0 %
Lymphocytes Relative: 28 %
Lymphs Abs: 1.5 10*3/uL (ref 0.7–4.0)
MCH: 30.5 pg (ref 26.0–34.0)
MCHC: 33.5 g/dL (ref 30.0–36.0)
MCV: 91.2 fL (ref 80.0–100.0)
Monocytes Absolute: 0.5 10*3/uL (ref 0.1–1.0)
Monocytes Relative: 9 %
Neutro Abs: 3.3 10*3/uL (ref 1.7–7.7)
Neutrophils Relative %: 62 %
Platelets: 188 10*3/uL (ref 150–400)
RBC: 4.75 MIL/uL (ref 3.87–5.11)
RDW: 11.8 % (ref 11.5–15.5)
WBC: 5.4 10*3/uL (ref 4.0–10.5)
nRBC: 0 % (ref 0.0–0.2)

## 2022-04-08 LAB — RESP PANEL BY RT-PCR (RSV, FLU A&B, COVID)  RVPGX2
Influenza A by PCR: NEGATIVE
Influenza B by PCR: NEGATIVE
Resp Syncytial Virus by PCR: NEGATIVE
SARS Coronavirus 2 by RT PCR: NEGATIVE

## 2022-04-08 LAB — COMPREHENSIVE METABOLIC PANEL
ALT: 16 U/L (ref 0–44)
AST: 18 U/L (ref 15–41)
Albumin: 4 g/dL (ref 3.5–5.0)
Alkaline Phosphatase: 69 U/L (ref 38–126)
Anion gap: 7 (ref 5–15)
BUN: 15 mg/dL (ref 8–23)
CO2: 26 mmol/L (ref 22–32)
Calcium: 8.5 mg/dL — ABNORMAL LOW (ref 8.9–10.3)
Chloride: 103 mmol/L (ref 98–111)
Creatinine, Ser: 0.83 mg/dL (ref 0.44–1.00)
GFR, Estimated: >60 mL/min (ref >60)
Glucose, Bld: 104 mg/dL — ABNORMAL HIGH (ref 70–99)
Potassium: 4.3 mmol/L (ref 3.5–5.1)
Sodium: 136 mmol/L (ref 135–145)
Total Bilirubin: 0.7 mg/dL (ref 0.3–1.2)
Total Protein: 7 g/dL (ref 6.5–8.1)

## 2022-04-08 MED ORDER — ONDANSETRON 4 MG PO TBDP: 4.0000 mg | ORAL_TABLET | Freq: Three times a day (TID) | ORAL | 0 refills | Status: DC | PRN

## 2022-04-08 MED ORDER — ONDANSETRON HCL 4 MG/2ML IJ SOLN
4.0000 mg | Freq: Once | INTRAMUSCULAR | Status: AC
  Administered 2022-04-08: 4 mg via INTRAVENOUS
  Filled 2022-04-08: qty 2

## 2022-04-08 MED ORDER — SODIUM CHLORIDE 0.9 % IV BOLUS
2000.0000 mL | Freq: Once | INTRAVENOUS | Status: AC
  Administered 2022-04-08: 2000 mL via INTRAVENOUS

## 2022-04-08 NOTE — ED Triage Notes (Signed)
Pt c/o general malaise feeling x 1 week, migraine with no hx started on Thursday, lightheadedness and vomiting started Friday. Pt reports she feels very dehydrated. Denies fever.

## 2022-04-08 NOTE — Discharge Instructions (Addendum)
Your testing today was reassuring showing no signs of COVID, flu, kidney infections or any signs of severe problems.  Your vital signs have look normal, we have been able to give you IV fluids and some nausea medicine and you seem to have improved.  I have asked the laboratory to do a urine culture which means they will grow the urine sample to see if any bacteria is present in it, this usually takes 48 hours.  If there is evidence of any infraction you will be contacted and an antibiotic will be started.  Please return to the ER immediately for any severe or worsening symptoms, otherwise follow-up with your doctor in 2 or 3 days if you are still having nausea vomiting or any other symptoms  Zofran is a medication which can help with nausea.  You may take 4 mg by mouth every 6 hours as needed if you are an adult, if your child under the age of 6 take half of a tablet or 2 mg every 6 hours as needed.  This should dissolve on your tongue within a short timeframe.  Wait about 30 minutes after taking it to help with drinking clear liquids.

## 2022-04-08 NOTE — ED Provider Notes (Signed)
[facility] EMERGENCY DEPARTMENT Provider Note   CSN: [id number] Arrival date & time: 04/08/22  7510     History  Chief Complaint  Patient presents with   Weakness    Rebekah Green is a 61 y.o. female.  HPI   This patient is a very pleasant 61 year old female, she has a history of recurrent pyelonephritis and cystitis about once per year, she also has a history of ductal carcinoma in situ breast cancer treated with local treatment, she still has both breast.  She is in full remission.  She presents with approximately 1 week of generally not feeling well, she developed a headache approximately 3 days ago and then over the last 2 days has had some progressive nausea poor appetite and that over the last 24 hours has not been able to hold down any food or fluids.  This morning she was not able to make any urine and what did come out was extremely dark and "thick".  She does not feel feverish, she does not have chills and she has not had any diarrhea.  She denies any significant coughing  Home Medications Prior to Admission medications   Medication Sig Start Date End Date Taking? Authorizing Provider  ondansetron (ZOFRAN-ODT) 4 MG disintegrating tablet Take 1 tablet (4 mg total) by mouth every 8 (eight) hours as needed for nausea. 04/08/22  Yes Noemi Chapel, MD  Ascorbic Acid (VITAMIN C) 1000 MG tablet Take 1,000 mg by mouth daily.    [provider]  Cholecalciferol (VITAMIN D) 2000 UNITS tablet Take 2,000 Units by mouth every morning.      [provider]  Multiple Vitamins-Minerals (MULTIVITAMINS THER. W/MINERALS) TABS Take 1 tablet by mouth every morning.      [provider]      Allergies    Patient has no known allergies.    Review of Systems   Review of Systems  All other systems reviewed and are negative.   Physical Exam Updated Vital Signs BP (!) 142/71   Pulse 71   Temp 98.5 F (36.9 C) (Oral)   Resp 16   Ht 1.651 m ('5\' 5"'$ )   Wt 59 kg    LMP 08/20/2014   SpO2 97%   BMI 21.63 kg/m  Physical Exam Vitals and nursing note reviewed.  Constitutional:      General: She is not in acute distress.    Appearance: She is well-developed.  HENT:     Head: Normocephalic and atraumatic.     Mouth/Throat:     Mouth: Mucous membranes are dry.     Pharynx: No oropharyngeal exudate.  Eyes:     General: No scleral icterus.       Right eye: No discharge.        Left eye: No discharge.     Conjunctiva/sclera: Conjunctivae normal.     Pupils: Pupils are equal, round, and reactive to light.  Neck:     Thyroid: No thyromegaly.     Vascular: No JVD.  Cardiovascular:     Rate and Rhythm: Normal rate and regular rhythm.     Heart sounds: Normal heart sounds. No murmur heard.    No friction rub. No gallop.  Pulmonary:     Effort: Pulmonary effort is normal. No respiratory distress.     Breath sounds: Normal breath sounds. No wheezing or rales.  Abdominal:     General: Bowel sounds are normal. There is no distension.     Palpations: Abdomen is soft.  There is no mass.     Tenderness: There is no abdominal tenderness.     Comments: No lower abdominal tenderness  Genitourinary:    Comments: No CVA tenderness Musculoskeletal:        General: No tenderness. Normal range of motion.     Cervical back: Normal range of motion and neck supple.  Lymphadenopathy:     Cervical: No cervical adenopathy.  Skin:    General: Skin is warm and dry.     Findings: No erythema or rash.  Neurological:     Mental Status: She is alert.     Coordination: Coordination normal.  Psychiatric:        Behavior: Behavior normal.     ED Results / Procedures / Treatments   Labs (all labs ordered are listed, but only abnormal results are displayed) Labs Reviewed  COMPREHENSIVE METABOLIC PANEL - Abnormal; Notable for the following components:      Result Value   Glucose, Bld 104 (*)    Calcium 8.5 (*)    All other components within normal limits   URINALYSIS, ROUTINE W REFLEX MICROSCOPIC - Abnormal; Notable for the following components:   APPearance CLOUDY (*)    Ketones, ur 20 (*)    Leukocytes,Ua TRACE (*)    All other components within normal limits  RESP PANEL BY RT-PCR (RSV, FLU A&B, COVID)  [id number]  URINE CULTURE  CBC WITH DIFFERENTIAL/PLATELET    EKG None  Radiology No results found.  Procedures Procedures    Medications Ordered in ED Medications  sodium chloride 0.9 % bolus 2,000 mL (0 mLs Intravenous Stopped 04/08/22 1140)  ondansetron (ZOFRAN) injection 4 mg (4 mg Intravenous Given 04/08/22 1013)    ED Course/ Medical Decision Making/ A&P Clinical Course as of 04/08/22 1206  Sun Apr 08, 2022  1011 CBC unremarkable, metabolic panel is extremely reassuring without any electrolyte or renal dysfunction whatsoever. [BM]    Clinical Course User Index [BM] Noemi Chapel, MD                           Medical Decision Making Amount and/or Complexity of Data Reviewed Labs: ordered.  Risk Prescription drug management.   This patient presents to the ED for concern of generalized fatigue and now nausea and vomiting with urinary symptoms, this involves an extensive number of treatment options, and is a complaint that carries with it a high risk of complications and morbidity.  The differential diagnosis includes COVID, flu, could be related to pyelonephritis or cystitis or urinary tract infection, she may have some significant dehydration and appears to be dry on exam   Co morbidities that complicate the patient evaluation  Breast cancer Recurrent urinary tract infections   Additional history obtained:  Additional history obtained from electronic medical record External records from outside source obtained and reviewed including notes from oncology, regarding treatment of breast cancer in the past   Lab Tests:  I Ordered, and personally interpreted labs.  The pertinent results include: CBC metabolic  panel urinalysis and COVID testing  Cardiac Monitoring: / EKG:  The patient was maintained on a cardiac monitor.  I personally viewed and interpreted the cardiac monitored which showed an underlying rhythm of: Normal sinus rhythm, no arrhythmias   Problem List / ED Course / Critical interventions / Medication management  I informed the patient of all of the results including the normal-appearing CBC metabolic panel and the unremarkable urinalysis with the absence of  bacteria.  The patient has been informed that even though they are feeling better after the antiemetics and IV fluids that they should return if symptoms should worsen.  Culture sent, given the leukocytes but absence of bacteria I ordered medication including IV fluids and IV ondansetron for nausea and dehydration Reevaluation of the patient after these medicines showed that the patient improved significantly I have reviewed the patients home medicines and have made adjustments as needed   Social Determinants of Health:  None   Test / Admission - Considered:  Considered admission but the patient's lab work was reassuring and she improved significantly with interventions as above. The patient was given her results, she expressed her understanding as did the family member at the bedside, stable for discharge         Final Clinical Impression(s) / ED Diagnoses Final diagnoses:  Nausea and vomiting, unspecified vomiting type  Dehydration    Rx / DC Orders ED Discharge Orders          Ordered    ondansetron (ZOFRAN-ODT) 4 MG disintegrating tablet  Every 8 hours PRN        04/08/22 1204              Noemi Chapel, MD 04/08/22 1206

## 2022-04-09 LAB — URINE CULTURE: Culture: NO GROWTH

## 2022-06-22 ENCOUNTER — Other Ambulatory Visit: Payer: Self-pay | Admitting: Hematology and Oncology

## 2022-06-22 DIAGNOSIS — Z853 Personal history of malignant neoplasm of breast: Secondary | ICD-10-CM

## 2022-07-06 ENCOUNTER — Ambulatory Visit
Admission: RE | Admit: 2022-07-06 | Discharge: 2022-07-06 | Disposition: A | Discharge status: Home / Self Care | Payer: 59 | Source: Ambulatory Visit | Attending: Hematology and Oncology | Admitting: Hematology and Oncology

## 2022-07-06 ENCOUNTER — Telehealth: Payer: Self-pay | Admitting: *Deleted

## 2022-07-06 DIAGNOSIS — D0512 Intraductal carcinoma in situ of left breast: Secondary | ICD-10-CM

## 2022-07-06 NOTE — Telephone Encounter (Signed)
Per NP request RN placed call to pt regarding recent bone density score showing T score -3.7 with recommendations to start bisphosphonate therapy.  Pt educated and states she would like to hold off on therapy at this time.  States she will discuss with her PCP and will alert our office of her decision during her visit in September.

## 2022-07-06 NOTE — Telephone Encounter (Signed)
-----   Message from Gardenia Phlegm, NP sent at 07/06/2022 12:46 PM EDT ----- Please let patient know that her bone density is about the same and we continue to recommend bisphosphonate therapy.  Happy to schedule an appointment with her to discuss further. ----- Message ----- From: Interface, Rad Results In Sent: 07/06/2022   8:42 AM EDT To: Nicholas Lose, MD

## 2022-07-24 ENCOUNTER — Emergency Department (HOSPITAL_COMMUNITY)
Admission: EM | Admit: 2022-07-24 | Discharge: 2022-07-24 | Discharge status: Against Medical Advice | Payer: 59 | Attending: Emergency Medicine | Admitting: Emergency Medicine

## 2022-07-24 ENCOUNTER — Encounter (HOSPITAL_COMMUNITY): Payer: Self-pay

## 2022-07-24 ENCOUNTER — Other Ambulatory Visit: Payer: Self-pay

## 2022-07-24 DIAGNOSIS — R1084 Generalized abdominal pain: Secondary | ICD-10-CM | POA: Insufficient documentation

## 2022-07-24 DIAGNOSIS — R110 Nausea: Secondary | ICD-10-CM | POA: Diagnosis not present

## 2022-07-24 DIAGNOSIS — R197 Diarrhea, unspecified: Secondary | ICD-10-CM | POA: Insufficient documentation

## 2022-07-24 DIAGNOSIS — Z5321 Procedure and treatment not carried out due to patient leaving prior to being seen by health care provider: Secondary | ICD-10-CM | POA: Diagnosis not present

## 2022-07-24 LAB — COMPREHENSIVE METABOLIC PANEL
ALT: 18 U/L (ref 0–44)
AST: 23 U/L (ref 15–41)
Albumin: 4 g/dL (ref 3.5–5.0)
Alkaline Phosphatase: 77 U/L (ref 38–126)
Anion gap: 8 (ref 5–15)
BUN: 17 mg/dL (ref 8–23)
CO2: 24 mmol/L (ref 22–32)
Calcium: 8.7 mg/dL — ABNORMAL LOW (ref 8.9–10.3)
Chloride: 105 mmol/L (ref 98–111)
Creatinine, Ser: 0.81 mg/dL (ref 0.44–1.00)
GFR, Estimated: >60 mL/min (ref >60)
Glucose, Bld: 94 mg/dL (ref 70–99)
Potassium: 4 mmol/L (ref 3.5–5.1)
Sodium: 137 mmol/L (ref 135–145)
Total Bilirubin: 0.8 mg/dL (ref 0.3–1.2)
Total Protein: 6.8 g/dL (ref 6.5–8.1)

## 2022-07-24 LAB — CBC
HCT: 43.3 % (ref 36.0–46.0)
Hemoglobin: 14.1 g/dL (ref 12.0–15.0)
MCH: 29.9 pg (ref 26.0–34.0)
MCHC: 32.6 g/dL (ref 30.0–36.0)
MCV: 91.7 fL (ref 80.0–100.0)
Platelets: 197 10*3/uL (ref 150–400)
RBC: 4.72 MIL/uL (ref 3.87–5.11)
RDW: 12.7 % (ref 11.5–15.5)
WBC: 7.8 10*3/uL (ref 4.0–10.5)
nRBC: 0 % (ref 0.0–0.2)

## 2022-07-24 LAB — LIPASE, BLOOD: Lipase: 31 U/L (ref 11–51)

## 2022-07-24 NOTE — ED Notes (Signed)
Pt told registration staff that she was leaving and did not want to wait any longer.

## 2022-07-24 NOTE — ED Triage Notes (Signed)
Pt c/o not feeling well x4 days and generalized abdominal pain and diarrhea x4 days.  Pt reports limited PO intake.  Pain score 2/10.  Pt reports taking Pepto w/o relief.

## 2022-08-31 ENCOUNTER — Ambulatory Visit
Admission: RE | Admit: 2022-08-31 | Discharge: 2022-08-31 | Disposition: A | Discharge status: Home / Self Care | Payer: 59 | Source: Ambulatory Visit | Attending: Hematology and Oncology | Admitting: Hematology and Oncology

## 2022-08-31 DIAGNOSIS — Z853 Personal history of malignant neoplasm of breast: Secondary | ICD-10-CM

## 2023-01-14 NOTE — Assessment & Plan Note (Addendum)
10/09/2017 for ductal carcinoma in situ, grade 2, estrogen and progesterone receptor negative 10/23/2017 left lumpectomy; No residual cancer 12/11/2017-01/08/2018: Adj XRT 10/18/17: Genetics Neg   Decided not to take anti-estrogens   Breast Cancer Surveillance: 1. Breast Exam: 01/21/23: Benign 2. Mammograms: 08/31/2022; Benign, density Cat C 3. Breast MRI: 02/27/21: Benign     Osteoporosis: T score -3.7 (07/06/2022): wPatient agreed to [place] once a year We will set her up in 2 weeks.  RTC in 1 year

## 2023-01-21 ENCOUNTER — Inpatient Hospital Stay: Payer: 59 | Attending: Hematology and Oncology | Admitting: Hematology and Oncology

## 2023-01-21 ENCOUNTER — Inpatient Hospital Stay: Payer: 59

## 2023-01-21 ENCOUNTER — Other Ambulatory Visit: Payer: Self-pay | Admitting: Hematology and Oncology

## 2023-01-21 VITALS — BP 133/69 | HR 72 | Temp 97.3°F | Resp 18 | Ht 65.0 in | Wt 133.3 lb

## 2023-01-21 DIAGNOSIS — Z86000 Personal history of in-situ neoplasm of breast: Secondary | ICD-10-CM | POA: Diagnosis present

## 2023-01-21 DIAGNOSIS — Z9189 Other specified personal risk factors, not elsewhere classified: Secondary | ICD-10-CM

## 2023-01-21 DIAGNOSIS — D0512 Intraductal carcinoma in situ of left breast: Secondary | ICD-10-CM | POA: Diagnosis not present

## 2023-01-21 DIAGNOSIS — M810 Age-related osteoporosis without current pathological fracture: Secondary | ICD-10-CM | POA: Insufficient documentation

## 2023-01-21 DIAGNOSIS — Z923 Personal history of irradiation: Secondary | ICD-10-CM | POA: Insufficient documentation

## 2023-01-21 LAB — CMP (CANCER CENTER ONLY)
ALT: 13 U/L (ref 0–44)
AST: 19 U/L (ref 15–41)
Albumin: 4.3 g/dL (ref 3.5–5.0)
Alkaline Phosphatase: 67 U/L (ref 38–126)
Anion gap: 5 (ref 5–15)
BUN: 15 mg/dL (ref 8–23)
CO2: 29 mmol/L (ref 22–32)
Calcium: 9.4 mg/dL (ref 8.9–10.3)
Chloride: 105 mmol/L (ref 98–111)
Creatinine: 0.9 mg/dL (ref 0.44–1.00)
GFR, Estimated: >60 mL/min (ref >60)
Glucose, Bld: 101 mg/dL — ABNORMAL HIGH (ref 70–99)
Potassium: 4.4 mmol/L (ref 3.5–5.1)
Sodium: 139 mmol/L (ref 135–145)
Total Bilirubin: 0.6 mg/dL (ref 0.3–1.2)
Total Protein: 6.6 g/dL (ref 6.5–8.1)

## 2023-01-21 LAB — CBC WITH DIFFERENTIAL (CANCER CENTER ONLY)
Abs Immature Granulocytes: 0 10*3/uL (ref 0.00–0.07)
Basophils Absolute: 0 10*3/uL (ref 0.0–0.1)
Basophils Relative: 1 %
Eosinophils Absolute: 0 10*3/uL (ref 0.0–0.5)
Eosinophils Relative: 1 %
HCT: 42.2 % (ref 36.0–46.0)
Hemoglobin: 13.9 g/dL (ref 12.0–15.0)
Immature Granulocytes: 0 %
Lymphocytes Relative: 35 %
Lymphs Abs: 1.7 10*3/uL (ref 0.7–4.0)
MCH: 30.2 pg (ref 26.0–34.0)
MCHC: 32.9 g/dL (ref 30.0–36.0)
MCV: 91.7 fL (ref 80.0–100.0)
Monocytes Absolute: 0.5 10*3/uL (ref 0.1–1.0)
Monocytes Relative: 10 %
Neutro Abs: 2.6 10*3/uL (ref 1.7–7.7)
Neutrophils Relative %: 53 %
Platelet Count: 178 10*3/uL (ref 150–400)
RBC: 4.6 MIL/uL (ref 3.87–5.11)
RDW: 12.4 % (ref 11.5–15.5)
WBC Count: 4.8 10*3/uL (ref 4.0–10.5)
nRBC: 0 % (ref 0.0–0.2)

## 2023-01-21 NOTE — Progress Notes (Signed)
Patient Care Team: Benita Stabile, MD as PCP - General (Internal Medicine) Almond Lint, MD as Consulting Physician (General Surgery) Lonie Peak, MD as Attending Physician (Radiation Oncology) Nita Sells, MD as Referring Physician (Dermatology) Key, Verita Schneiders, NP as Nurse Practitioner (Gynecology) Axel Filler Larna Daughters, NP as Nurse Practitioner (Hematology and Oncology)  DIAGNOSIS:  Encounter Diagnoses  Name Primary?   Ductal carcinoma in situ (DCIS) of left breast Yes   At high risk for breast cancer     SUMMARY OF ONCOLOGIC HISTORY: Oncology History  Ductal carcinoma in situ (DCIS) of left breast  10/09/2017 Initial Diagnosis   status post left breast upper outer quadrant biopsy for ductal carcinoma in situ, grade 2, estrogen and progesterone receptor negative.   10/18/2017 Genetic Testing   genetics testing offered through [facility] Common Hereditary Cancer Panel showed no deleterious mutations in: APC, ATM, AXIN2, BARD1, BMPR1A, BRCA1, BRCA2, BRIP1, CDH1, CDK4, CDKN2A (p14ARF), CDKN2A (p16INK4a), CHEK2, CTNNA1, DICER1, EPCAM*, GREM1*, KIT, MEN1, MLH1, MSH2, MSH3, MSH6, MUTYH, NBN, NF1, PALB2, PDGFRA, PMS2, POLD1,POLE, PTEN, RAD50, RAD51C, RAD51D, SDHB, SDHC, SDHD, SMAD4, SMARCA4, STK11, TP53, TSC1, TSC2, VHL The following genes were evaluated for sequence changes only: HOXB13*, NTHL1*, SDHA   10/23/2017 Surgery    left lumpectomy showed only the healing biopsy site, no residual malignancy   12/11/2017 - 01/08/2018 Radiation Therapy   Site/dose:   1. Left breast, 2.67 Gy x 15 fractions for a total dose of 40.05 Gy                      2. Left breast boost, 2 Gy x  5 fractions for a total dose of 10 Gy    Miscellaneous   breast cancer high risk (24% lifetime risk of developing another breast cancer)             (a) considered prophylactic antiestrogens: opted against             (b) opted for intensified screening                         (i) MRI yearly (December)                          (ii) mammography with tomography yearly (June)                         (iii) bi-annual breast exam     CHIEF COMPLIANT:   Discussed the use of AI scribe software for clinical note transcription with the patient, who gave verbal consent to proceed.  History of Present Illness   The patient, a five-year breast cancer survivor, presents for a routine follow-up. She reports no pain, discomfort, or concerns. She has chosen not to undergo hormone therapy and wishes to continue regular follow-ups to monitor her condition. She has been paying out-of-pocket for her medical expenses, as her insurance only covers a portion of the costs. She is due for a breast MRI in November, which she is prepared to pay for out-of-pocket if necessary.  The patient also has osteoporosis, with a bone density of -2.7, which is quite severe. She has been taking 1200mg  of calcium daily, but this is not sufficient for her bone health. She has been offered medication for her osteoporosis, but is concerned about the side effects. She has been trying to manage her condition through exercise and a healthy  diet. She works for a [employer], community and has no dental issues.         ALLERGIES:  has No Known Allergies.  MEDICATIONS:  Current Outpatient Medications  Medication Sig Dispense Refill   Ascorbic Acid (VITAMIN C) 1000 MG tablet Take 1,000 mg by mouth daily.     Ca Phosphate-Cholecalciferol (CALCIUM WITH D3 PO)      Cholecalciferol (VITAMIN D) 2000 UNITS tablet Take 2,000 Units by mouth every morning.       Multiple Vitamins-Minerals (MULTIVITAMINS THER. W/MINERALS) TABS Take 1 tablet by mouth every morning.       ondansetron (ZOFRAN-ODT) 4 MG disintegrating tablet Take 1 tablet (4 mg total) by mouth every 8 (eight) hours as needed for nausea. (Patient not taking: Reported on 01/21/2023) 10 tablet 0   No current facility-administered medications for this visit.    PHYSICAL EXAMINATION: ECOG PERFORMANCE STATUS: 1 -  Symptomatic but completely ambulatory  Vitals:   01/21/23 0810  BP: 133/69  Pulse: 72  Resp: 18  Temp: (!) 97.3 F (36.3 C)  SpO2: 100%   Filed Weights   01/21/23 0810  Weight: 133 lb 4.8 oz (60.5 kg)    LABORATORY DATA:  I have reviewed the data as listed    Latest Ref Rng & Units 07/24/2022    2:26 PM 04/08/2022    9:36 AM 10/16/2017   12:20 PM  CMP  Glucose 70 - 99 mg/dL 94  086  578   BUN 8 - 23 mg/dL 17  15  14    Creatinine 0.44 - 1.00 mg/dL 4.69  6.29  5.28   Sodium 135 - 145 mmol/L 137  136  141   Potassium 3.5 - 5.1 mmol/L 4.0  4.3  3.9   Chloride 98 - 111 mmol/L 105  103  103   CO2 22 - 32 mmol/L 24  26  30    Calcium 8.9 - 10.3 mg/dL 8.7  8.5  9.5   Total Protein 6.5 - 8.1 g/dL 6.8  7.0  7.3   Total Bilirubin 0.3 - 1.2 mg/dL 0.8  0.7  0.4   Alkaline Phos 38 - 126 U/L 77  69  89   AST 15 - 41 U/L 23  18  18    ALT 0 - 44 U/L 18  16  14      Lab Results  Component Value Date   WBC 7.8 07/24/2022   HGB 14.1 07/24/2022   HCT 43.3 07/24/2022   MCV 91.7 07/24/2022   PLT 197 07/24/2022   NEUTROABS 3.3 04/08/2022    ASSESSMENT & PLAN:  Ductal carcinoma in situ (DCIS) of left breast 10/09/2017 for ductal carcinoma in situ, grade 2, estrogen and progesterone receptor negative 10/23/2017 left lumpectomy; No residual cancer 12/11/2017-01/08/2018: Adj XRT 10/18/17: Genetics Neg   Decided not to take anti-estrogens   Breast Cancer Surveillance: 1. Breast Exam: 01/21/23: Benign 2. Mammograms: 08/31/2022; Benign, density Cat C 3. Breast MRI: 02/27/21: Benign     Osteoporosis: T score -3.7 (07/06/2022): wPatient agreed to [place] Zometa once a year We will set her up in 2 weeks.      Breast Cancer Surveillance Five years post-diagnosis, patient declined hormone therapy. Dense breasts (C-D) on mammogram and MRI. Patient wishes to continue annual surveillance. -Order annual MRI in November 2024.  Osteoporosis Bone density T-score -3.7, indicating severe osteoporosis.  Patient currently taking 1200mg  calcium daily. Discussed options for additional treatment including oral medications, injections, and infusions. Patient expressed concern about side effects of  oral medications. -Order labs today to check calcium levels. -Schedule Zometa infusion in two weeks, pending lab results and insurance approval. -Plan for repeat bone density scan after a couple of infusions to assess response to treatment.       Orders Placed This Encounter  Procedures   MR BREAST BILATERAL W WO CONTRAST INC CAD    Standing Status:   Future    Standing Expiration Date:   01/21/2024    Order Specific Question:   If indicated for the ordered procedure, I authorize the administration of contrast media per Radiology protocol    Answer:   Yes    Order Specific Question:   What is the patient's sedation requirement?    Answer:   No Sedation    Order Specific Question:   Does the patient have a pacemaker or implanted devices?    Answer:   No    Order Specific Question:   Preferred imaging location?    Answer:   [facility] (table limit-550lbs)    Order Specific Question:   Release to patient    Answer:   Immediate   The patient has a good understanding of the overall plan. she agrees with it. she will call with any problems that may develop before the next visit here. Total time spent: 30 mins including face to face time and time spent for planning, charting and co-ordination of care   Tamsen Meek, MD 01/21/23

## 2023-01-28 ENCOUNTER — Other Ambulatory Visit: Payer: Self-pay | Admitting: Hematology and Oncology

## 2023-01-28 DIAGNOSIS — M810 Age-related osteoporosis without current pathological fracture: Secondary | ICD-10-CM | POA: Insufficient documentation

## 2023-01-28 NOTE — Progress Notes (Signed)
Osteoporosis: Placed orders for Zometa infusion once a year

## 2023-02-04 ENCOUNTER — Ambulatory Visit: Payer: 59

## 2023-02-07 ENCOUNTER — Other Ambulatory Visit: Payer: 59

## 2023-02-07 ENCOUNTER — Other Ambulatory Visit: Payer: Self-pay | Admitting: *Deleted

## 2023-02-07 ENCOUNTER — Encounter: Payer: Self-pay | Admitting: *Deleted

## 2023-02-07 DIAGNOSIS — D0512 Intraductal carcinoma in situ of left breast: Secondary | ICD-10-CM

## 2023-02-07 NOTE — Progress Notes (Signed)
Per NP pt needing labs prior to Zometa infusion tomorrow.  Appt scheduled, RN attempt x1 to contact pt.  No answer, LVM with appt details.

## 2023-02-08 ENCOUNTER — Inpatient Hospital Stay: Payer: 59 | Attending: Hematology and Oncology

## 2023-02-08 ENCOUNTER — Inpatient Hospital Stay: Payer: 59

## 2023-02-08 VITALS — BP 107/62 | HR 63 | Temp 97.7°F | Resp 17

## 2023-02-08 DIAGNOSIS — D0512 Intraductal carcinoma in situ of left breast: Secondary | ICD-10-CM

## 2023-02-08 DIAGNOSIS — M810 Age-related osteoporosis without current pathological fracture: Secondary | ICD-10-CM | POA: Diagnosis present

## 2023-02-08 DIAGNOSIS — Z86000 Personal history of in-situ neoplasm of breast: Secondary | ICD-10-CM | POA: Insufficient documentation

## 2023-02-08 LAB — CBC WITH DIFFERENTIAL (CANCER CENTER ONLY)
Abs Immature Granulocytes: 0.01 10*3/uL (ref 0.00–0.07)
Basophils Absolute: 0.1 10*3/uL (ref 0.0–0.1)
Basophils Relative: 1 %
Eosinophils Absolute: 0.1 10*3/uL (ref 0.0–0.5)
Eosinophils Relative: 1 %
HCT: 42.9 % (ref 36.0–46.0)
Hemoglobin: 14.5 g/dL (ref 12.0–15.0)
Immature Granulocytes: 0 %
Lymphocytes Relative: 34 %
Lymphs Abs: 2.2 10*3/uL (ref 0.7–4.0)
MCH: 30.5 pg (ref 26.0–34.0)
MCHC: 33.8 g/dL (ref 30.0–36.0)
MCV: 90.1 fL (ref 80.0–100.0)
Monocytes Absolute: 0.5 10*3/uL (ref 0.1–1.0)
Monocytes Relative: 7 %
Neutro Abs: 3.7 10*3/uL (ref 1.7–7.7)
Neutrophils Relative %: 57 %
Platelet Count: 174 10*3/uL (ref 150–400)
RBC: 4.76 MIL/uL (ref 3.87–5.11)
RDW: 12.1 % (ref 11.5–15.5)
WBC Count: 6.5 10*3/uL (ref 4.0–10.5)
nRBC: 0 % (ref 0.0–0.2)

## 2023-02-08 LAB — CMP (CANCER CENTER ONLY)
ALT: 12 U/L (ref 0–44)
AST: 19 U/L (ref 15–41)
Albumin: 4.4 g/dL (ref 3.5–5.0)
Alkaline Phosphatase: 66 U/L (ref 38–126)
Anion gap: 9 (ref 5–15)
BUN: 16 mg/dL (ref 8–23)
CO2: 26 mmol/L (ref 22–32)
Calcium: 9.2 mg/dL (ref 8.9–10.3)
Chloride: 105 mmol/L (ref 98–111)
Creatinine: 0.88 mg/dL (ref 0.44–1.00)
GFR, Estimated: >60 mL/min (ref >60)
Glucose, Bld: 87 mg/dL (ref 70–99)
Potassium: 3.8 mmol/L (ref 3.5–5.1)
Sodium: 140 mmol/L (ref 135–145)
Total Bilirubin: 0.6 mg/dL (ref 0.3–1.2)
Total Protein: 6.8 g/dL (ref 6.5–8.1)

## 2023-02-08 MED ORDER — SODIUM CHLORIDE 0.9 % IV SOLN: Freq: Once | INTRAVENOUS | Status: AC

## 2023-02-08 MED ORDER — ZOLEDRONIC ACID 4 MG/100ML IV SOLN
4.0000 mg | Freq: Once | INTRAVENOUS | Status: AC
  Administered 2023-02-08: 4 mg via INTRAVENOUS
  Filled 2023-02-08: qty 100

## 2023-02-08 NOTE — Patient Instructions (Signed)

## 2023-03-05 ENCOUNTER — Other Ambulatory Visit: Payer: 59

## 2023-03-08 ENCOUNTER — Telehealth: Payer: Self-pay | Admitting: *Deleted

## 2023-03-08 ENCOUNTER — Ambulatory Visit
Admission: RE | Admit: 2023-03-08 | Discharge: 2023-03-08 | Disposition: A | Discharge status: Home / Self Care | Payer: 59 | Source: Ambulatory Visit | Attending: Hematology and Oncology

## 2023-03-08 DIAGNOSIS — Z9189 Other specified personal risk factors, not elsewhere classified: Secondary | ICD-10-CM

## 2023-03-08 DIAGNOSIS — D0512 Intraductal carcinoma in situ of left breast: Secondary | ICD-10-CM

## 2023-03-08 MED ORDER — GADOPICLENOL 0.5 MMOL/ML IV SOLN
6.0000 mL | Freq: Once | INTRAVENOUS | Status: AC | PRN
  Administered 2023-03-08: 6 mL via INTRAVENOUS

## 2023-03-08 NOTE — Telephone Encounter (Signed)
RN placed call to pt with below information per MD request. Pt verbalized understanding.

## 2023-03-08 NOTE — Telephone Encounter (Signed)
-----   Message from Tamsen Meek sent at 03/08/2023  1:51 PM EST ----- Please give her these normal MRI results. V ----- Message ----- From: Interface, Rad Results In Sent: 03/08/2023  12:39 PM EST To: Serena Croissant, MD

## 2023-08-09 ENCOUNTER — Other Ambulatory Visit: Payer: Self-pay | Admitting: Hematology and Oncology

## 2023-08-09 DIAGNOSIS — Z9889 Other specified postprocedural states: Secondary | ICD-10-CM

## 2023-09-05 ENCOUNTER — Encounter: Payer: Self-pay | Admitting: Hematology and Oncology

## 2023-09-05 ENCOUNTER — Ambulatory Visit
Admission: RE | Admit: 2023-09-05 | Discharge: 2023-09-05 | Disposition: A | Discharge status: Home / Self Care | Source: Ambulatory Visit | Attending: Hematology and Oncology

## 2023-09-05 DIAGNOSIS — Z9889 Other specified postprocedural states: Secondary | ICD-10-CM

## 2023-12-02 ENCOUNTER — Encounter: Payer: Self-pay | Admitting: Internal Medicine

## 2024-01-20 ENCOUNTER — Other Ambulatory Visit: Payer: Self-pay

## 2024-01-20 DIAGNOSIS — D0512 Intraductal carcinoma in situ of left breast: Secondary | ICD-10-CM

## 2024-01-21 ENCOUNTER — Inpatient Hospital Stay: Payer: 59 | Admitting: Hematology and Oncology

## 2024-01-21 ENCOUNTER — Inpatient Hospital Stay: Payer: 59 | Attending: Hematology and Oncology

## 2024-01-21 VITALS — BP 100/68 | HR 63 | Temp 97.9°F | Resp 18 | Ht 65.0 in | Wt 133.1 lb

## 2024-01-21 DIAGNOSIS — Z780 Asymptomatic menopausal state: Secondary | ICD-10-CM

## 2024-01-21 DIAGNOSIS — M810 Age-related osteoporosis without current pathological fracture: Secondary | ICD-10-CM | POA: Diagnosis not present

## 2024-01-21 DIAGNOSIS — Z923 Personal history of irradiation: Secondary | ICD-10-CM | POA: Insufficient documentation

## 2024-01-21 DIAGNOSIS — D0512 Intraductal carcinoma in situ of left breast: Secondary | ICD-10-CM

## 2024-01-21 DIAGNOSIS — Z9189 Other specified personal risk factors, not elsewhere classified: Secondary | ICD-10-CM | POA: Diagnosis not present

## 2024-01-21 DIAGNOSIS — Z86000 Personal history of in-situ neoplasm of breast: Secondary | ICD-10-CM | POA: Diagnosis present

## 2024-01-21 LAB — CBC WITH DIFFERENTIAL (CANCER CENTER ONLY)
Abs Immature Granulocytes: 0.02 K/uL (ref 0.00–0.07)
Basophils Absolute: 0.1 K/uL (ref 0.0–0.1)
Basophils Relative: 1 %
Eosinophils Absolute: 0.1 K/uL (ref 0.0–0.5)
Eosinophils Relative: 1 %
HCT: 40.2 % (ref 36.0–46.0)
Hemoglobin: 13.4 g/dL (ref 12.0–15.0)
Immature Granulocytes: 0 %
Lymphocytes Relative: 24 %
Lymphs Abs: 1.8 K/uL (ref 0.7–4.0)
MCH: 30.4 pg (ref 26.0–34.0)
MCHC: 33.3 g/dL (ref 30.0–36.0)
MCV: 91.2 fL (ref 80.0–100.0)
Monocytes Absolute: 0.6 K/uL (ref 0.1–1.0)
Monocytes Relative: 8 %
Neutro Abs: 5 K/uL (ref 1.7–7.7)
Neutrophils Relative %: 66 %
Platelet Count: 184 K/uL (ref 150–400)
RBC: 4.41 MIL/uL (ref 3.87–5.11)
RDW: 12 % (ref 11.5–15.5)
WBC Count: 7.6 K/uL (ref 4.0–10.5)
nRBC: 0 % (ref 0.0–0.2)

## 2024-01-21 LAB — CMP (CANCER CENTER ONLY)
ALT: 11 U/L (ref 0–44)
AST: 19 U/L (ref 15–41)
Albumin: 4.3 g/dL (ref 3.5–5.0)
Alkaline Phosphatase: 53 U/L (ref 38–126)
Anion gap: 3 — ABNORMAL LOW (ref 5–15)
BUN: 17 mg/dL (ref 8–23)
CO2: 31 mmol/L (ref 22–32)
Calcium: 9.1 mg/dL (ref 8.9–10.3)
Chloride: 105 mmol/L (ref 98–111)
Creatinine: 0.9 mg/dL (ref 0.44–1.00)
GFR, Estimated: >60 mL/min (ref >60)
Glucose, Bld: 95 mg/dL (ref 70–99)
Potassium: 4.3 mmol/L (ref 3.5–5.1)
Sodium: 139 mmol/L (ref 135–145)
Total Bilirubin: 0.5 mg/dL (ref 0.0–1.2)
Total Protein: 6.4 g/dL — ABNORMAL LOW (ref 6.5–8.1)

## 2024-01-21 NOTE — Progress Notes (Signed)
 HEMATOLOGY-ONCOLOGY TELEPHONE VISIT PROGRESS NOTE  I connected with our patient on 01/21/24 at  9:30 AM EDT by telephone and verified that I am speaking with the correct person using two identifiers.  I discussed the limitations, risks, security and privacy concerns of performing an evaluation and management service by telephone and the availability of in person appointments.  I also discussed with the patient that there may be a patient responsible charge related to this service. The patient expressed understanding and agreed to proceed.   History of Present Illness: Annual follow-up of osteoporosis and history of DCIS  History of Present Illness REATHA SUR is a 63 year old female with osteoporosis who presents for follow-up on her bone health management.  She has been receiving annual Zometa  infusions for over six years without adverse effects and is satisfied with the treatment. She adheres to a daily regimen of calcium and vitamin D supplements. Her last bone density test was in March 2024.  Her breast health is monitored with mammograms and biannual breast MRIs, with the most recent mammogram in May showing normal results. An initial breast issue was discovered incidentally during a cyst evaluation.    Oncology History  Ductal carcinoma in situ (DCIS) of left breast  10/09/2017 Initial Diagnosis   status post left breast upper outer quadrant biopsy for ductal carcinoma in situ, grade 2, estrogen and progesterone receptor negative.   10/18/2017 Genetic Testing   genetics testing offered through [facility] Common Hereditary Cancer Panel showed no deleterious mutations in: APC, ATM, AXIN2, BARD1, BMPR1A, BRCA1, BRCA2, BRIP1, CDH1, CDK4, CDKN2A (p14ARF), CDKN2A (p16INK4a), CHEK2, CTNNA1, DICER1, EPCAM*, GREM1*, KIT, MEN1, MLH1, MSH2, MSH3, MSH6, MUTYH, NBN, NF1, PALB2, PDGFRA, PMS2, POLD1,POLE, PTEN, RAD50, RAD51C, RAD51D, SDHB, SDHC, SDHD, SMAD4, SMARCA4, STK11, TP53, TSC1, TSC2, VHL The  following genes were evaluated for sequence changes only: HOXB13*, NTHL1*, SDHA   10/23/2017 Surgery    left lumpectomy showed only the healing biopsy site, no residual malignancy   12/11/2017 - 01/08/2018 Radiation Therapy   Site/dose:   1. Left breast, 2.67 Gy x 15 fractions for a total dose of 40.05 Gy                      2. Left breast boost, 2 Gy x  5 fractions for a total dose of 10 Gy    Miscellaneous   breast cancer high risk (24% lifetime risk of developing another breast cancer)             (a) considered prophylactic antiestrogens: opted against             (b) opted for intensified screening                         (i) MRI yearly (December)                         (ii) mammography with tomography yearly (June)                         (iii) bi-annual breast exam     REVIEW OF SYSTEMS:   Constitutional: Denies fevers, chills or abnormal weight loss All other systems were reviewed with the patient and are negative. Observations/Objective:     Assessment Plan:  Ductal carcinoma in situ (DCIS) of left breast 10/09/2017 for ductal carcinoma in situ, grade 2, estrogen and progesterone receptor negative 10/23/2017  left lumpectomy; No residual cancer 12/11/2017-01/08/2018: Adj XRT 10/18/17: Genetics Neg   Decided not to take anti-estrogens   Breast Cancer Surveillance: 1. Breast Exam: 01/21/24: Benign 2. Mammograms: 09/05/2023; Benign, density Cat C 3. Breast MRI: 03/08/2023: Benign density category C   Will order breast MRI to be done November Osteoporosis: T score -3.7 (07/06/2022):  Zometa  once a year, continue with calcium and vitamin D  Return to clinic in 1 year for follow-up along with labs and Zometa        I discussed the assessment and treatment plan with the patient. The patient was provided an opportunity to ask questions and all were answered. The patient agreed with the plan and demonstrated an understanding of the instructions. The patient was advised to call  back or seek an in-person evaluation if the symptoms worsen or if the condition fails to improve as anticipated.   I provided 20 minutes of non-face-to-face time during this encounter.  This includes time for charting and coordination of care   Naomi MARLA Chad, MD

## 2024-01-21 NOTE — Assessment & Plan Note (Signed)
 10/09/2017 for ductal carcinoma in situ, grade 2, estrogen and progesterone receptor negative 10/23/2017 left lumpectomy; No residual cancer 12/11/2017-01/08/2018: Adj XRT 10/18/17: Genetics Neg   Decided not to take anti-estrogens   Breast Cancer Surveillance: 1. Breast Exam: 01/21/24: Benign 2. Mammograms: 09/05/2023; Benign, density Cat C 3. Breast MRI: 03/08/2023: Benign density category C   Osteoporosis: T score -3.7 (07/06/2022):  Zometa  once a year  Return to clinic in 1 year for follow-up

## 2024-02-13 ENCOUNTER — Telehealth: Payer: Self-pay

## 2024-02-13 NOTE — Telephone Encounter (Signed)
 Per Dr. Gudena - okay to proceed with Zometa  treatment without updated labs.  Utilize lab work from 9/30.

## 2024-02-14 ENCOUNTER — Inpatient Hospital Stay: Attending: Hematology and Oncology

## 2024-02-14 VITALS — BP 114/69 | HR 56 | Temp 98.0°F | Resp 19 | Wt 133.5 lb

## 2024-02-14 DIAGNOSIS — M810 Age-related osteoporosis without current pathological fracture: Secondary | ICD-10-CM | POA: Diagnosis present

## 2024-02-14 DIAGNOSIS — Z86000 Personal history of in-situ neoplasm of breast: Secondary | ICD-10-CM | POA: Insufficient documentation

## 2024-02-14 MED ORDER — SODIUM CHLORIDE 0.9 % IV SOLN: Freq: Once | INTRAVENOUS | Status: AC

## 2024-02-14 MED ORDER — ZOLEDRONIC ACID 4 MG/100ML IV SOLN
4.0000 mg | Freq: Once | INTRAVENOUS | Status: AC
  Administered 2024-02-14: 4 mg via INTRAVENOUS
  Filled 2024-02-14: qty 100

## 2024-03-13 ENCOUNTER — Other Ambulatory Visit

## 2024-03-27 ENCOUNTER — Encounter: Payer: Self-pay | Admitting: Hematology and Oncology

## 2024-03-27 ENCOUNTER — Ambulatory Visit
Admission: RE | Admit: 2024-03-27 | Discharge: 2024-03-27 | Disposition: A | Source: Ambulatory Visit | Attending: Hematology and Oncology

## 2024-03-27 DIAGNOSIS — Z9189 Other specified personal risk factors, not elsewhere classified: Secondary | ICD-10-CM

## 2024-03-27 DIAGNOSIS — D0512 Intraductal carcinoma in situ of left breast: Secondary | ICD-10-CM

## 2024-03-27 MED ORDER — GADOPICLENOL 0.5 MMOL/ML IV SOLN
6.0000 mL | Freq: Once | INTRAVENOUS | Status: AC | PRN
  Administered 2024-03-27: 6 mL via INTRAVENOUS

## 2024-03-30 ENCOUNTER — Ambulatory Visit: Payer: Self-pay | Admitting: Hematology and Oncology

## 2024-03-30 NOTE — Telephone Encounter (Signed)
 I informed the patient that the breast MRI is normal

## 2024-09-03 ENCOUNTER — Other Ambulatory Visit (HOSPITAL_BASED_OUTPATIENT_CLINIC_OR_DEPARTMENT_OTHER)

## 2025-02-11 ENCOUNTER — Other Ambulatory Visit

## 2025-02-11 ENCOUNTER — Ambulatory Visit

## 2025-02-11 ENCOUNTER — Ambulatory Visit: Admitting: Hematology and Oncology
